# Patient Record
Sex: Female | Born: 1975 | Race: White | Hispanic: No | Marital: Married | Smoking: Never smoker
Health system: Southern US, Community
[De-identification: ages and names within clinical notes are randomized; demographics above are authoritative.]

## PROBLEM LIST (undated history)

## (undated) DIAGNOSIS — Z803 Family history of malignant neoplasm of breast: Secondary | ICD-10-CM

## (undated) DIAGNOSIS — Z973 Presence of spectacles and contact lenses: Secondary | ICD-10-CM

## (undated) DIAGNOSIS — E669 Obesity, unspecified: Secondary | ICD-10-CM

## (undated) DIAGNOSIS — G43909 Migraine, unspecified, not intractable, without status migrainosus: Secondary | ICD-10-CM

## (undated) HISTORY — DX: Obesity, unspecified: E66.9

## (undated) HISTORY — DX: Family history of malignant neoplasm of breast: Z80.3

## (undated) HISTORY — DX: Migraine, unspecified, not intractable, without status migrainosus: G43.909

## (undated) HISTORY — PX: VAGINAL HYSTERECTOMY: SUR661

## (undated) HISTORY — PX: TONSILLECTOMY: SUR1361

---
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
## 1999-06-01 HISTORY — PX: BREAST BIOPSY: SHX20

## 2005-05-31 HISTORY — PX: ROUX-EN-Y GASTRIC BYPASS: SHX1104

## 2009-11-07 ENCOUNTER — Ambulatory Visit: Payer: Self-pay

## 2009-11-08 ENCOUNTER — Ambulatory Visit: Payer: Self-pay

## 2011-04-07 ENCOUNTER — Ambulatory Visit: Payer: Self-pay

## 2011-05-19 ENCOUNTER — Ambulatory Visit: Payer: Self-pay

## 2011-07-07 LAB — HM MAMMOGRAPHY: HM Mammogram: NORMAL

## 2012-05-04 ENCOUNTER — Ambulatory Visit: Payer: Self-pay | Admitting: Family Medicine

## 2012-05-05 ENCOUNTER — Ambulatory Visit: Payer: Self-pay | Admitting: Internal Medicine

## 2013-05-08 ENCOUNTER — Ambulatory Visit: Payer: Self-pay

## 2013-06-17 ENCOUNTER — Ambulatory Visit: Payer: Self-pay | Admitting: Physician Assistant

## 2014-04-17 ENCOUNTER — Ambulatory Visit: Payer: Self-pay | Admitting: Physician Assistant

## 2014-08-28 LAB — LIPID PANEL
Cholesterol: 157 mg/dL (ref 0–200)
HDL: 53 mg/dL (ref 35–70)
LDL Cholesterol: 90 mg/dL
Triglycerides: 68 mg/dL (ref 40–160)

## 2014-08-28 LAB — TSH: TSH: 2.93 u[IU]/mL (ref <=5.90)

## 2014-08-28 LAB — HM PAP SMEAR

## 2014-09-16 ENCOUNTER — Encounter: Payer: Self-pay | Admitting: Internal Medicine

## 2014-09-16 DIAGNOSIS — Z9884 Bariatric surgery status: Secondary | ICD-10-CM | POA: Insufficient documentation

## 2014-09-16 DIAGNOSIS — Z1239 Encounter for other screening for malignant neoplasm of breast: Secondary | ICD-10-CM | POA: Insufficient documentation

## 2014-09-16 DIAGNOSIS — F5101 Primary insomnia: Secondary | ICD-10-CM | POA: Insufficient documentation

## 2014-09-16 DIAGNOSIS — K219 Gastro-esophageal reflux disease without esophagitis: Secondary | ICD-10-CM | POA: Insufficient documentation

## 2014-09-16 DIAGNOSIS — M545 Low back pain, unspecified: Secondary | ICD-10-CM | POA: Insufficient documentation

## 2014-09-16 DIAGNOSIS — Z87898 Personal history of other specified conditions: Secondary | ICD-10-CM | POA: Insufficient documentation

## 2014-09-16 DIAGNOSIS — G43009 Migraine without aura, not intractable, without status migrainosus: Secondary | ICD-10-CM | POA: Insufficient documentation

## 2014-09-21 ENCOUNTER — Ambulatory Visit
Admit: 2014-09-21 | Disposition: A | Discharge status: Home / Self Care | Payer: Self-pay | Attending: Family Medicine | Admitting: Family Medicine

## 2014-11-28 ENCOUNTER — Ambulatory Visit
Admission: EM | Admit: 2014-11-28 | Discharge: 2014-11-28 | Disposition: A | Discharge status: Home / Self Care | Payer: 59 | Attending: Emergency Medicine | Admitting: Emergency Medicine

## 2014-11-28 ENCOUNTER — Encounter: Payer: Self-pay | Admitting: Emergency Medicine

## 2014-11-28 DIAGNOSIS — N390 Urinary tract infection, site not specified: Secondary | ICD-10-CM

## 2014-11-28 DIAGNOSIS — R300 Dysuria: Secondary | ICD-10-CM | POA: Diagnosis present

## 2014-11-28 LAB — URINALYSIS COMPLETE WITH MICROSCOPIC (ARMC ONLY)
Bilirubin Urine: NEGATIVE
GLUCOSE, UA: NEGATIVE mg/dL
KETONES UR: NEGATIVE mg/dL
Nitrite: NEGATIVE
Protein, ur: NEGATIVE mg/dL
SPECIFIC GRAVITY, URINE: 1.01 (ref 1.005–1.030)
pH: 6 (ref 5.0–8.0)

## 2014-11-28 MED ORDER — CEPHALEXIN 500 MG PO CAPS: 500.0000 mg | ORAL_CAPSULE | Freq: Two times a day (BID) | ORAL | Status: DC

## 2014-11-28 MED ORDER — PHENAZOPYRIDINE HCL 200 MG PO TABS: 200.0000 mg | ORAL_TABLET | Freq: Three times a day (TID) | ORAL | Status: DC | PRN

## 2014-11-28 NOTE — ED Provider Notes (Addendum)
HPI  SUBJECTIVE:  Christina Carlson is a 39 y.o. female who presents with  pt with dysuria, urinary urgency, frequency, cloudy, oderous urine and  lower abdominal pain/pressure after urinating starting yesterday. C/o lower back pain.   No aggravating or alleviating factors.  Tried increasing fluids without improvement.  No nausea, vomiting, fever, chills.  No hematuria.  No anorexia, other abdominal pain, abdominal distention. Patient is currently on menses, but denies discharge/odor, vulvar itching, genital rash. Patient is in a monoagmous sexual relationship with a female partner who is asxatic.   Pt  is not pregnant. No recent antibiotic, diabetes, nephrolithiasis.  No h/o STD's, BV, yeast infections.  States this feels similar to previous UTIs.    No past medical history on file.    Past Surgical History  Procedure Laterality Date  . Roux-en-y gastric bypass  2007  . Inguinal hernia repair  1981  . Tonsillectomy      No family history on file.  History  Substance Use Topics  . Smoking status: Never Smoker   . Smokeless tobacco: Not on file  . Alcohol Use: No    No current facility-administered medications for this encounter.  Current outpatient prescriptions:  .  cephALEXin (KEFLEX) 500 MG capsule, Take 1 capsule (500 mg total) by mouth 2 (two) times daily., Disp: 14 capsule, Rfl: 0 .  Omeprazole 20 MG TBEC, Take 1 tablet by mouth daily., Disp: , Rfl:  .  phenazopyridine (PYRIDIUM) 200 MG tablet, Take 1 tablet (200 mg total) by mouth 3 (three) times daily as needed for pain., Disp: 6 tablet, Rfl: 0 .  zolpidem (AMBIEN CR) 12.5 MG CR tablet, Take 1 tablet by mouth at bedtime., Disp: , Rfl:   Allergies  Allergen Reactions  . Sulfa Antibiotics      ROS  As noted in HPI.   Physical Exam  BP 142/91 mmHg  Pulse 63  Temp(Src) 97.8 F (36.6 C) (Oral)  Resp 16  Ht  (1.626 m)  Wt 200 lb (90.719 kg)  BMI 34.31 kg/m2  SpO2 100%  LMP 11/24/2014 (Exact  Date)  Constitutional: Well developed, well nourished, no acute distress Eyes:  EOMI, conjunctiva normal bilaterally HENT: Normocephalic, atraumatic,mucus membranes moist Respiratory: Normal inspiratory effort Cardiovascular: Normal rate GI: nondistended soft, mild suprapubic tenderness. No flank tenderness Back: No CVA tenderness skin: No rash, skin intact Musculoskeletal: no deformities Neurologic: Alert & oriented x 3, no focal neuro deficits Psychiatric: Speech and behavior appropriate   ED Course   Medications - No data to display  Orders Placed This Encounter  Procedures  . Urine culture    Standing Status: Standing     Number of Occurrences: 1     Standing Expiration Date:     Order Specific Question:  Patient immune status    Answer:  Normal  . Urinalysis complete, with microscopic    Standing Status: Standing     Number of Occurrences: 1     Standing Expiration Date:     Results for orders placed or performed during the hospital encounter of 11/28/14 (from the past 24 hour(s))  Urinalysis complete, with microscopic     Status: Abnormal   Collection Time: 11/28/14  6:03 PM  Result Value Ref Range   Color, Urine STRAW (A) YELLOW   APPearance HAZY (A) CLEAR   Glucose, UA NEGATIVE NEGATIVE mg/dL   Bilirubin Urine NEGATIVE NEGATIVE   Ketones, ur NEGATIVE NEGATIVE mg/dL   Specific Gravity, Urine 1.010 1.005 - 1.030  Hgb urine dipstick 2+ (A) NEGATIVE   pH 6.0 5.0 - 8.0   Protein, ur NEGATIVE NEGATIVE mg/dL   Nitrite NEGATIVE NEGATIVE   Leukocytes, UA TRACE (A) NEGATIVE   RBC / HPF 0-5 <3 RBC/hpf   WBC, UA 0-5 <3 WBC/hpf   Bacteria, UA FEW (A) RARE   Squamous Epithelial / LPF 0-5 (A) RARE   No results found.  ED Clinical Impression  UTI (lower urinary tract infection)  ED Assessment/Plan  H&P, UA consistent with UTI.  Home with Keflex, Pyridium. No urine culture as this is uncomplicated lower UTI.   Discussed labs,  plan and followup with patient.  Discussed sn/sx that should prompt return to the UC or ED. Patient agrees with plan.   *This clinic note was created using Dragon dictation software. Therefore, there may be occasional mistakes despite careful proofreading.  ?   Domenick GongAshley Keeghan Mcintire, MD 11/28/14 1932  Domenick GongAshley Curt Oatis, MD 11/28/14 [phone]

## 2014-11-28 NOTE — ED Notes (Signed)
UTI symptoms started yesterday morning, pt has frequency, urgency , burning. Pt on period now.

## 2014-11-29 ENCOUNTER — Ambulatory Visit: Payer: Self-pay | Admitting: Internal Medicine

## 2014-11-30 LAB — URINE CULTURE: SPECIAL REQUESTS: NORMAL

## 2015-02-05 ENCOUNTER — Other Ambulatory Visit: Payer: Self-pay

## 2015-02-05 MED ORDER — ZOLPIDEM TARTRATE ER 12.5 MG PO TBCR: 12.5000 mg | EXTENDED_RELEASE_TABLET | Freq: Every day | ORAL | Status: DC

## 2015-02-19 ENCOUNTER — Other Ambulatory Visit: Payer: Self-pay | Admitting: Internal Medicine

## 2015-02-19 MED ORDER — ZOLPIDEM TARTRATE ER 12.5 MG PO TBCR: 12.5000 mg | EXTENDED_RELEASE_TABLET | Freq: Every day | ORAL | Status: DC

## 2015-03-04 ENCOUNTER — Ambulatory Visit
Admission: EM | Admit: 2015-03-04 | Discharge: 2015-03-04 | Disposition: A | Discharge status: Home / Self Care | Payer: BLUE CROSS/BLUE SHIELD | Attending: Family Medicine | Admitting: Family Medicine

## 2015-03-04 ENCOUNTER — Encounter: Payer: Self-pay | Admitting: Emergency Medicine

## 2015-03-04 DIAGNOSIS — B349 Viral infection, unspecified: Secondary | ICD-10-CM

## 2015-03-04 DIAGNOSIS — J069 Acute upper respiratory infection, unspecified: Secondary | ICD-10-CM

## 2015-03-04 LAB — RAPID INFLUENZA A&B ANTIGENS (ARMC ONLY): INFLUENZA A (ARMC): NOT DETECTED

## 2015-03-04 LAB — RAPID STREP SCREEN (MED CTR MEBANE ONLY): STREPTOCOCCUS, GROUP A SCREEN (DIRECT): NEGATIVE

## 2015-03-04 LAB — RAPID INFLUENZA A&B ANTIGENS: Influenza B (ARMC): NOT DETECTED

## 2015-03-04 MED ORDER — MELOXICAM 15 MG PO TABS: 15.0000 mg | ORAL_TABLET | Freq: Every day | ORAL | Status: DC

## 2015-03-04 MED ORDER — FEXOFENADINE-PSEUDOEPHED ER 180-240 MG PO TB24: 1.0000 | ORAL_TABLET | Freq: Every day | ORAL | Status: DC

## 2015-03-04 NOTE — ED Notes (Signed)
Patient c/o sore throat, cough, headaches, and stuffy nose since Saturday.

## 2015-03-04 NOTE — Discharge Instructions (Signed)
Upper Respiratory Infection, Adult An upper respiratory infection (URI) is also known as the common cold. It is often caused by a type of germ (virus). Colds are easily spread (contagious). You can pass it to others by kissing, coughing, sneezing, or drinking out of the same glass. Usually, you get better in 1 or 2 weeks.  HOME CARE   Only take medicine as told by your doctor.  Use a warm mist humidifier or breathe in steam from a hot shower.  Drink enough water and fluids to keep your pee (urine) clear or pale yellow.  Get plenty of rest.  Return to work when your temperature is back to normal or as told by your doctor. You may use a face mask and wash your hands to stop your cold from spreading. GET HELP RIGHT AWAY IF:   After the first few days, you feel you are getting worse.  You have questions about your medicine.  You have chills, shortness of breath, or brown or red spit (mucus).  You have yellow or brown snot (nasal discharge) or pain in the face, especially when you bend forward.  You have a fever, puffy (swollen) neck, pain when you swallow, or white spots in the back of your throat.  You have a bad headache, ear pain, sinus pain, or chest pain.  You have a high-pitched whistling sound when you breathe in and out (wheezing).  You have a lasting cough or cough up blood.  You have sore muscles or a stiff neck. MAKE SURE YOU:   Understand these instructions.  Will watch your condition.  Will get help right away if you are not doing well or get worse. Document Released: 11/03/2007 Document Revised: 08/09/2011 Document Reviewed: 08/22/2013 [facility] Patient Information 2015 [place], [place]. This information is not intended to replace advice given to you by your health care provider. Make sure you discuss any questions you have with your health care provider.  Viral Exanthems, Adult A viral exanthem is a rash. It occurs when a type of germ (virus) infects the skin. It  usually goes away on its own, without treatment. HOME CARE  Only take medicines as told by your doctor.  Drink enough water and fluids to keep your pee (urine) clear or pale yellow. GET HELP RIGHT AWAY IF:  You feel lumps or bumps in the neck.  Your face feels tender near the eyes and nose (sinuses).  You are not getting better after 3 days.  You have muscle aches.  You feel very tired.  You have a cough and thick spit (mucus).  You have a fever.  You have red eyes or eye pain.  You have sores in your mouth and trouble drinking or eating.  You have a sore throat with yellowish-white fluid (pus) and trouble swallowing.  You have neck pain or a stiff neck.  You get a severe headache.  You cannot stop throwing up (vomiting). MAKE SURE YOU:  Understand these instructions.  Will watch your condition.  Will get help right away if you are not doing well or get worse. Document Released: 09/01/2010 Document Revised: 08/09/2011 Document Reviewed: 09/01/2010 [facility] Patient Information 2015 [place], [place]. This information is not intended to replace advice given to you by your health care provider. Make sure you discuss any questions you have with your health care provider.

## 2015-03-04 NOTE — ED Provider Notes (Signed)
CSN: [id number]     Arrival date & time 03/04/15  1404 History   First MD Initiated Contact with Patient 03/04/15 1457     Chief Complaint  Patient presents with  . Sore Throat  . Cough  . Headache   (Consider location/radiation/quality/duration/timing/severity/associated sxs/prior Treatment) Patient is a 39 y.o. female presenting with pharyngitis, cough, headaches, and URI. The history is provided by the patient.  Sore Throat This is a new problem. The current episode started more than 2 days ago. Associated symptoms include headaches. Pertinent negatives include no abdominal pain and no shortness of breath. Associated symptoms comments: Headache was only yesterday.. Nothing relieves the symptoms. She has tried acetaminophen for the symptoms.  Cough Associated symptoms: headaches, myalgias and rhinorrhea   Associated symptoms: no shortness of breath   Headache Associated symptoms: cough, fatigue, myalgias and URI   Associated symptoms: no abdominal pain   URI Presenting symptoms: cough, fatigue and rhinorrhea   Severity:  Moderate Onset quality:  Sudden Progression:  Worsening Chronicity:  New Relieved by:  Nothing Ineffective treatments:  OTC medications Associated symptoms: headaches and myalgias     History reviewed. No pertinent past medical history. Past Surgical History  Procedure Laterality Date  . Roux-en-y gastric bypass  2007  . Inguinal hernia repair  1981  . Tonsillectomy     History reviewed. No pertinent family history. Social History  Substance Use Topics  . Smoking status: Never Smoker   . Smokeless tobacco: None  . Alcohol Use: No   OB History    No data available     Review of Systems  Constitutional: Positive for fatigue.  HENT: Positive for rhinorrhea.   Respiratory: Positive for cough. Negative for shortness of breath.   Gastrointestinal: Negative for abdominal pain.  Musculoskeletal: Positive for myalgias.  Neurological: Positive for  headaches.  All other systems reviewed and are negative.   Allergies  Sulfa antibiotics  Home Medications   Prior to Admission medications   Medication Sig Start Date End Date Taking? Authorizing Provider  zolpidem (AMBIEN) 10 MG tablet Take 10 mg by mouth at bedtime as needed for sleep.   Yes Historical Provider, MD  cephALEXin (KEFLEX) 500 MG capsule Take 1 capsule (500 mg total) by mouth 2 (two) times daily. 11/28/14   Domenick Gong, MD  fexofenadine-pseudoephedrine (ALLEGRA-D ALLERGY & CONGESTION) 180-240 MG 24 hr tablet Take 1 tablet by mouth daily. 03/04/15   Hassan Rowan, MD  meloxicam (MOBIC) 15 MG tablet Take 1 tablet (15 mg total) by mouth daily. 03/04/15   Hassan Rowan, MD  Omeprazole 20 MG TBEC Take 1 tablet by mouth daily. 08/28/14   Historical Provider, MD  phenazopyridine (PYRIDIUM) 200 MG tablet Take 1 tablet (200 mg total) by mouth 3 (three) times daily as needed for pain. 11/28/14   Domenick Gong, MD  zolpidem (AMBIEN CR) 12.5 MG CR tablet Take 1 tablet (12.5 mg total) by mouth at bedtime. 02/19/15   [name], MD   Meds Ordered and Administered this Visit  Medications - No data to display  BP 121/64 mmHg  Pulse 78  Temp(Src) 98.6 F (37 C) (Tympanic)  Resp 16  Ht  (1.626 m)  Wt 190 lb (86.183 kg)  BMI 32.60 kg/m2  SpO2 100%  LMP 02/23/2015 (Exact Date) No data found.   Physical Exam  Constitutional: She is oriented to person, place, and time. She appears well-developed and well-nourished.  HENT:  Head: Normocephalic and atraumatic.  Right Ear: Hearing, tympanic  membrane and external ear normal.  Left Ear: Hearing, tympanic membrane and external ear normal.  Nose: Mucosal edema and rhinorrhea present. Right sinus exhibits maxillary sinus tenderness. Left sinus exhibits maxillary sinus tenderness.  Mouth/Throat: No oral lesions. No dental abscesses. Posterior oropharyngeal erythema present.  Eyes: Conjunctivae are normal. Pupils are equal, round,  and reactive to light.  Neck: Normal range of motion. Neck supple.  Cardiovascular: Normal rate and regular rhythm.   Pulmonary/Chest: Effort normal and breath sounds normal.  Musculoskeletal: Normal range of motion.  Neurological: She is alert and oriented to person, place, and time.  Skin: Skin is warm and dry.  Psychiatric: She has a normal mood and affect. Her behavior is normal.  Vitals reviewed.   ED Course  Procedures (including critical care time)  Labs Review Labs Reviewed  RAPID STREP SCREEN (NOT AT [facility])  INFLUENZA A&B ANTIGENS ([facility] ONLY)  CULTURE, GROUP A STREP ([facility] ONLY)    Imaging Review No results found.   Visual Acuity Review  Right Eye Distance:   Left Eye Distance:   Bilateral Distance:    Right Eye Near:   Left Eye Near:    Bilateral Near:         MDM   1. Nonspecific syndrome suggestive of viral illness   2. URI, acute       Hassan Rowan, MD 03/04/15 [phone]

## 2015-03-06 LAB — CULTURE, GROUP A STREP (THRC)

## 2015-03-13 ENCOUNTER — Other Ambulatory Visit: Payer: Self-pay | Admitting: Internal Medicine

## 2015-03-13 MED ORDER — ZOLPIDEM TARTRATE 10 MG PO TABS: 10.0000 mg | ORAL_TABLET | Freq: Every evening | ORAL | Status: DC | PRN

## 2015-05-07 ENCOUNTER — Other Ambulatory Visit: Payer: Self-pay | Admitting: Internal Medicine

## 2015-05-07 ENCOUNTER — Encounter: Payer: Self-pay | Admitting: Internal Medicine

## 2015-05-07 ENCOUNTER — Ambulatory Visit (INDEPENDENT_AMBULATORY_CARE_PROVIDER_SITE_OTHER): Payer: BLUE CROSS/BLUE SHIELD | Admitting: Internal Medicine

## 2015-05-07 VITALS — BP 120/68 | HR 68 | Ht 64.0 in | Wt 219.0 lb

## 2015-05-07 DIAGNOSIS — Z9884 Bariatric surgery status: Secondary | ICD-10-CM

## 2015-05-07 DIAGNOSIS — E538 Deficiency of other specified B group vitamins: Secondary | ICD-10-CM | POA: Diagnosis not present

## 2015-05-07 DIAGNOSIS — M545 Low back pain, unspecified: Secondary | ICD-10-CM

## 2015-05-07 NOTE — Progress Notes (Signed)
Date:  05/07/2015   Name:  Christina MadeiraCristy L Carlson   DOB:  06-23-1975   MRN:  [id number]   Chief Complaint: Hyperlipidemia  Patient is here to discuss a recent cholesterol profile done for an insurance physical. Her total cholesterol was 202 with an HDL of 53 and an LDL of 120. Her total cholesterol to HDL ratio is 3.5. She is not hypertensive, diabetic, or a smoker so her risk for heart disease over the next 10 years is extremely low and no medication is recommended.  B12 deficiency - patient had a low B12 level earlier this year. she was encouraged to take a B12 supplement on a daily basis. However, she has not started it yet. She feels fairly well without significant fatigue and low energy or muscle discomfort .  Review of Systems  Constitutional: Negative for fever, chills and fatigue.  Respiratory: Negative for chest tightness, shortness of breath and wheezing.   Cardiovascular: Negative for chest pain, palpitations and leg swelling.  Musculoskeletal: Negative for myalgias and arthralgias.  Skin: Negative for color change and rash.  Neurological: Negative for headaches.  Psychiatric/Behavioral: Positive for sleep disturbance (unless she takes [place]).    Patient Active Problem List   Diagnosis Date Noted  . Vitamin B12 nutritional deficiency 05/07/2015  . LBP (low back pain) 09/16/2014  . Breast screening 09/16/2014  . Bariatric surgery status 09/16/2014  . Gastro-esophageal reflux disease without esophagitis 09/16/2014  . H/O motion sickness 09/16/2014  . Migraine without aura and responsive to treatment 09/16/2014  . Idiopathic insomnia 09/16/2014    Prior to Admission medications   Medication Sig Start Date End Date Taking? Authorizing Provider  LORZONE 750 MG TABS Take 1 tablet by mouth 3 (three) times daily. 05/03/15  Yes Historical Provider, MD  zolpidem (AMBIEN) 10 MG tablet Take 1 tablet by mouth at bedtime as needed. 04/17/15  Yes Historical Provider, MD    Allergies   Allergen Reactions  . Sulfa Antibiotics     Past Surgical History  Procedure Laterality Date  . Roux-en-y gastric bypass  2007  . Inguinal hernia repair  1981  . Tonsillectomy      Social History  Substance Use Topics  . Smoking status: Never Smoker   . Smokeless tobacco: None  . Alcohol Use: No    Medication list has been reviewed and updated.   Physical Exam  Constitutional: She is oriented to person, place, and time. She appears well-developed. No distress.  HENT:  Head: Normocephalic and atraumatic.  Eyes: Conjunctivae are normal. Right eye exhibits no discharge. Left eye exhibits no discharge. No scleral icterus.  Pulmonary/Chest: Effort normal. No respiratory distress.  Musculoskeletal: Normal range of motion.  Neurological: She is alert and oriented to person, place, and time.  Skin: Skin is warm and dry. No rash noted.  Psychiatric: She has a normal mood and affect. Her behavior is normal. Thought content normal.    BP 120/68 mmHg  Pulse 68  Ht 5\' 4"  (1.626 m)  Wt 219 lb (99.338 kg)  BMI 37.57 kg/m2  SpO2 98%  Assessment and Plan: 1. Vitamin B12 nutritional deficiency Patient is encouraged to begin sublingual vitamin D supplement daily  2. Bariatric surgery status Doing well with recently normal labs Cholesterol is slightly higher than previously - no meds are needed  follow a low-fat diet and regular exercise   Bari EdwardLaura Kannon Baum, MD [facility]  05/07/2015

## 2015-05-16 ENCOUNTER — Encounter: Payer: Self-pay | Admitting: *Deleted

## 2015-05-16 ENCOUNTER — Ambulatory Visit
Admission: EM | Admit: 2015-05-16 | Discharge: 2015-05-16 | Disposition: A | Discharge status: Home / Self Care | Payer: BLUE CROSS/BLUE SHIELD | Attending: Family Medicine | Admitting: Family Medicine

## 2015-05-16 DIAGNOSIS — J029 Acute pharyngitis, unspecified: Secondary | ICD-10-CM | POA: Diagnosis not present

## 2015-05-16 LAB — RAPID STREP SCREEN (MED CTR MEBANE ONLY): Streptococcus, Group A Screen (Direct): NEGATIVE

## 2015-05-16 MED ORDER — AMOXICILLIN 875 MG PO TABS: 875.0000 mg | ORAL_TABLET | Freq: Two times a day (BID) | ORAL | Status: DC

## 2015-05-16 NOTE — Discharge Instructions (Signed)

## 2015-05-16 NOTE — ED Provider Notes (Signed)
[facility]  ____________________________________________  Time seen: Approximately 6:56 PM  I have reviewed the triage vital signs and the nursing notes.   HISTORY  Chief Complaint Sore Throat   HPI Christina Carlson is a 39 y.o. female presents for complaints of sore throat since yesterday. States quick onset and has persisted. Reports scratchy and sore to swallow at 6/10. Denies other pain or radiation. Reports continues to drink fluids well, with slight decrease in appetite. Denies sick contacts at home but reports frequently around school aged children. Also reports mild headache earlier, denies headache now.   Denies cough, nasal congestion, chest pain, shortness of breath, abdominal pain, rash or other concerns.    History reviewed. No pertinent past medical history.  Patient Active Problem List   Diagnosis Date Noted  . Vitamin B12 nutritional deficiency 05/07/2015  . LBP (low back pain) 09/16/2014  . Breast screening 09/16/2014  . Bariatric surgery status 09/16/2014  . Gastro-esophageal reflux disease without esophagitis 09/16/2014  . H/O motion sickness 09/16/2014  . Migraine without aura and responsive to treatment 09/16/2014  . Idiopathic insomnia 09/16/2014    Past Surgical History  Procedure Laterality Date  . Roux-en-y gastric bypass  2007  . Inguinal hernia repair  1981  . Tonsillectomy      Current Outpatient Rx  Name  Route  Sig  Dispense  Refill  . LORZONE 750 MG TABS   Oral   Take 1 tablet by mouth 3 (three) times daily.      0     Dispense as written.   . zolpidem (AMBIEN) 10 MG tablet   Oral   Take 1 tablet by mouth at bedtime as needed.      5   .             Allergies Sulfa antibiotics  History reviewed. No pertinent family history.  Social History Social History  Substance Use Topics  . Smoking status: Never Smoker   . Smokeless tobacco: None  . Alcohol Use: No    Review of Systems Constitutional: No  fever/chills Eyes: No visual changes. ENT: positive sore throat.  Cardiovascular: Denies chest pain. Respiratory: Denies shortness of breath. Gastrointestinal: No abdominal pain.  No nausea, no vomiting.  No diarrhea.  No constipation. Genitourinary: Negative for dysuria. Musculoskeletal: Negative for back pain. Skin: Negative for rash. Neurological: Negative for headaches, focal weakness or numbness.  10-point ROS otherwise negative.  ____________________________________________   PHYSICAL EXAM:  VITAL SIGNS: ED Triage Vitals  Enc Vitals Group     BP 05/16/15 1820 139/97 mmHg     Pulse Rate 05/16/15 1820 79     Resp 05/16/15 1820 18     Temp 05/16/15 1820 97.7 F (36.5 C)     Temp Source 05/16/15 1820 Oral     SpO2 05/16/15 1820 100 %     Weight 05/16/15 1820 220 lb (99.791 kg)     Height 05/16/15 1820  (1.626 m)     Head Cir --      Peak Flow --      Pain Score --      Pain Loc --      Pain Edu? --      Excl. in GC? --     Constitutional: Alert and oriented. Well appearing and in no acute distress. Eyes: Conjunctivae are normal. PERRL. EOMI. Head: Atraumatic. Nontender, no swelling, no erythema.   Ears: no erythema, normal TMs bilaterally.   Nose: No congestion/rhinnorhea.  Mouth/Throat: Mucous membranes are moist.  Mod pharyngeal erythema, no exudate. No uvular shift or deviation.  Neck: No stridor.  No cervical spine tenderness to palpation. Hematological/Lymphatic/Immunilogical: Mild anterior cervical lymphadenopathy. Cardiovascular: Normal rate, regular rhythm. Grossly normal heart sounds.  Good peripheral circulation. Respiratory: Normal respiratory effort.  No retractions. Lungs CTAB. Gastrointestinal: Soft and nontender. No distention. Normal Bowel sounds.  No hepatomegaly or splenomegaly palpated.  Musculoskeletal: No lower or upper extremity tenderness nor edema.   Neurologic:  Normal speech and language. No gross focal neurologic deficits are  appreciated. No gait instability. Skin:  Skin is warm, dry and intact. No rash noted. Psychiatric: Mood and affect are normal. Speech and behavior are normal.  ____________________________________________   LABS (all labs ordered are listed, but only abnormal results are displayed)  Labs Reviewed  RAPID STREP SCREEN ([facility])  CULTURE, GROUP A STREP ([facility] ONLY)    INITIAL IMPRESSION / ASSESSMENT AND PLAN / ED COURSE  Pertinent labs & imaging results that were available during my care of the patient were reviewed by me and considered in my medical decision making (see chart for details).  Very well appearing. No acute distress. Presents for sore throat since yesterday. Mod pharyngeal erythema. Quick strep negative, will culture. However reports exposed to children recently with strep. Reports continues to drink fluids well. Concern for streptococcal pharyngitis and will treat with oral amoxicillin and supportive treatments including rest, fluids, prn otc tylenol, warm salt water gargles, lozenges. Discussed follow up with Primary care physician this week. Discussed follow up and return parameters including no resolution or any worsening concerns. Patient verbalized understanding and agreed to plan.   ____________________________________________   FINAL CLINICAL IMPRESSION(S) / ED DIAGNOSES  Final diagnoses:  Pharyngitis       Renford DillsLindsey Vidya Bamford, NP 05/21/15 1742

## 2015-05-16 NOTE — ED Notes (Signed)
Patient started having symptoms of swollen neck glands and sore throat yesterday. She has a history of strep throat. Additional symptoms are headache with no fever.

## 2015-05-18 LAB — CULTURE, GROUP A STREP (THRC)

## 2015-06-27 ENCOUNTER — Ambulatory Visit (INDEPENDENT_AMBULATORY_CARE_PROVIDER_SITE_OTHER): Payer: BLUE CROSS/BLUE SHIELD | Admitting: Internal Medicine

## 2015-06-27 ENCOUNTER — Encounter: Payer: Self-pay | Admitting: Internal Medicine

## 2015-06-27 VITALS — BP 122/68 | HR 72 | Ht 64.0 in | Wt 216.0 lb

## 2015-06-27 DIAGNOSIS — Z30011 Encounter for initial prescription of contraceptive pills: Secondary | ICD-10-CM

## 2015-06-27 DIAGNOSIS — R87610 Atypical squamous cells of undetermined significance on cytologic smear of cervix (ASC-US): Secondary | ICD-10-CM | POA: Insufficient documentation

## 2015-06-27 DIAGNOSIS — E538 Deficiency of other specified B group vitamins: Secondary | ICD-10-CM | POA: Diagnosis not present

## 2015-06-27 MED ORDER — CYANOCOBALAMIN 1000 MCG/ML IJ SOLN
1000.0000 ug | INTRAMUSCULAR | Status: DC
  Administered 2015-06-27 – 2017-12-14 (×8): 1000 ug via INTRAMUSCULAR

## 2015-06-27 MED ORDER — DROSPIRENONE-ETHINYL ESTRADIOL 3-0.02 MG PO TABS: 1.0000 | ORAL_TABLET | Freq: Every day | ORAL | Status: DC

## 2015-06-27 NOTE — Progress Notes (Signed)
Date:  06/27/2015   Name:  Christina Carlson   DOB:  03-25-1976   MRN:  [id number]   Chief Complaint: Follow-up; Vitamin B12 Nutritional Deficiency; and Contraception HPI Vitamin B12 deficiency - can not be complaint with oral B12 drops.  She is s/p gastric bypass and can not absorb very well. When she was getting injections she felt much better and would like to resume those. Contraception - she stopped her birth control pills several years ago and has been using no form of contraception. She is over 40 she is more concerned for prevention and would like to resume them. She had no side effects. She is a nonsmoker. No history of stroke or DVT.   Review of Systems  Constitutional: Positive for unexpected weight change (gained back 20 lbs - resuming exercise and diet). Negative for fever, chills and fatigue.  Eyes: Negative for visual disturbance.  Respiratory: Negative for cough, chest tightness and shortness of breath.   Cardiovascular: Negative for chest pain, palpitations and leg swelling.  Genitourinary: Negative for dysuria and menstrual problem.  Musculoskeletal: Negative for arthralgias and gait problem.    Patient Active Problem List   Diagnosis Date Noted  . Vitamin B12 nutritional deficiency 05/07/2015  . LBP (low back pain) 09/16/2014  . Breast screening 09/16/2014  . Bariatric surgery status 09/16/2014  . Gastro-esophageal reflux disease without esophagitis 09/16/2014  . H/O motion sickness 09/16/2014  . Migraine without aura and responsive to treatment 09/16/2014  . Idiopathic insomnia 09/16/2014    Prior to Admission medications   Medication Sig Start Date End Date Taking? Authorizing Provider  LORZONE 750 MG TABS Take 1 tablet by mouth 3 (three) times daily. 05/03/15  Yes Historical Provider, MD  zolpidem (AMBIEN) 10 MG tablet Take 1 tablet by mouth at bedtime as needed. 04/17/15  Yes Historical Provider, MD    Allergies  Allergen Reactions  . Sulfa Antibiotics      Past Surgical History  Procedure Laterality Date  . Roux-en-y gastric bypass  2007  . Inguinal hernia repair  1981  . Tonsillectomy      Social History  Substance Use Topics  . Smoking status: Never Smoker   . Smokeless tobacco: None  . Alcohol Use: No     Medication list has been reviewed and updated.   Physical Exam  Constitutional: She is oriented to person, place, and time. She appears well-developed and well-nourished.  Eyes: Conjunctivae are normal. Pupils are equal, round, and reactive to light.  Neck: Normal range of motion. Neck supple. No thyromegaly present.  Cardiovascular: Normal rate, regular rhythm and normal heart sounds.   Pulmonary/Chest: Effort normal and breath sounds normal.  Musculoskeletal: She exhibits no edema or tenderness.  Lymphadenopathy:    She has no cervical adenopathy.  Neurological: She is alert and oriented to person, place, and time.  Skin: Skin is warm and dry.  Psychiatric: She has a normal mood and affect. Her behavior is normal.    BP 122/68 mmHg  Pulse 72  Ht  (1.626 m)  Wt 216 lb (97.977 kg)  BMI 37.06 kg/m2  LMP 06/10/2015  Assessment and Plan: 1. Encounter for initial prescription of contraceptive pills Resume oral contraceptives Return for physical in 4 months - drospirenone-ethinyl estradiol (YAZ,GIANVI,LORYNA) 3-0.02 MG tablet; Take 1 tablet by mouth daily.  Dispense: 1 Package; Refill: 11  2. Vitamin B12 nutritional deficiency Begin monthly injections - cyanocobalamin ((VITAMIN B-12)) injection 1,000 mcg; Inject 1 mL (1,000 mcg total)  into the muscle every 30 (thirty) days.  3. Atypical squamous cells of undetermined significance on cytologic smear of cervix (ASC-US) HPV was negative Repeat this year  Bari Edward, MD [facility]  06/27/2015

## 2015-08-15 ENCOUNTER — Other Ambulatory Visit: Payer: Self-pay | Admitting: Internal Medicine

## 2015-09-03 ENCOUNTER — Ambulatory Visit (INDEPENDENT_AMBULATORY_CARE_PROVIDER_SITE_OTHER): Payer: BLUE CROSS/BLUE SHIELD | Admitting: Internal Medicine

## 2015-09-03 ENCOUNTER — Encounter: Payer: Self-pay | Admitting: Internal Medicine

## 2015-09-03 VITALS — BP 140/82 | HR 72 | Ht 64.0 in | Wt 212.8 lb

## 2015-09-03 DIAGNOSIS — K219 Gastro-esophageal reflux disease without esophagitis: Secondary | ICD-10-CM

## 2015-09-03 DIAGNOSIS — Z1239 Encounter for other screening for malignant neoplasm of breast: Secondary | ICD-10-CM | POA: Diagnosis not present

## 2015-09-03 DIAGNOSIS — Z0000 Encounter for general adult medical examination without abnormal findings: Secondary | ICD-10-CM | POA: Diagnosis not present

## 2015-09-03 DIAGNOSIS — R87610 Atypical squamous cells of undetermined significance on cytologic smear of cervix (ASC-US): Secondary | ICD-10-CM | POA: Diagnosis not present

## 2015-09-03 DIAGNOSIS — E538 Deficiency of other specified B group vitamins: Secondary | ICD-10-CM

## 2015-09-03 DIAGNOSIS — F5101 Primary insomnia: Secondary | ICD-10-CM

## 2015-09-03 LAB — POCT URINALYSIS DIPSTICK
Bilirubin, UA: NEGATIVE
Glucose, UA: NEGATIVE
Ketones, UA: NEGATIVE
LEUKOCYTES UA: NEGATIVE
NITRITE UA: NEGATIVE
PH UA: 6.5
PROTEIN UA: NEGATIVE
Spec Grav, UA: 1.015
Urobilinogen, UA: 0.2

## 2015-09-03 MED ORDER — CYANOCOBALAMIN 1000 MCG/ML IJ SOLN
1000.0000 ug | INTRAMUSCULAR | Status: DC
  Administered 2015-09-17 – 2016-04-14 (×7): 1000 ug via INTRAMUSCULAR

## 2015-09-03 MED ORDER — SCOPOLAMINE 1 MG/3DAYS TD PT72: 1.0000 | MEDICATED_PATCH | TRANSDERMAL | Status: DC

## 2015-09-03 MED ORDER — CYANOCOBALAMIN 1000 MCG/ML IJ SOLN
1000.0000 ug | Freq: Once | INTRAMUSCULAR | Status: AC
  Administered 2015-09-03: 1000 ug via INTRAMUSCULAR

## 2015-09-03 NOTE — Progress Notes (Signed)
Date:  09/03/2015   Name:  Christina Carlson   DOB:  [birth year]   MRN:  [id number]   Chief Complaint: Annual Exam Christina Carlson is a 40 y.o. female who presents today for her Complete Annual Exam. She feels well. She reports exercising walking daily. She reports she is sleeping fairly well with Ambien - still takes about an hour to fall asleep.  [facility] Last pap in 2016 was ASCUS with neg HPV.  Periods are regular and lighter now on OCPs.   Review of Systems  Constitutional: Negative for fever, chills and fatigue.  HENT: Negative for congestion, hearing loss, tinnitus, trouble swallowing and voice change.   Eyes: Negative for visual disturbance.  Respiratory: Negative for cough, chest tightness, shortness of breath and wheezing.   Cardiovascular: Negative for chest pain, palpitations and leg swelling.  Gastrointestinal: Negative for vomiting, abdominal pain, diarrhea and constipation.  Endocrine: Negative for polydipsia and polyuria.  Genitourinary: Negative for dysuria, frequency, vaginal bleeding, vaginal discharge and genital sores.  Musculoskeletal: Negative for joint swelling, arthralgias and gait problem.  Skin: Negative for color change and rash.  Neurological: Negative for dizziness, tremors, light-headedness and headaches.  Hematological: Negative for adenopathy. Does not bruise/bleed easily.  Psychiatric/Behavioral: Negative for sleep disturbance and dysphoric mood. The patient is not nervous/anxious.     Patient Active Problem List   Diagnosis Date Noted  . Atypical squamous cells of undetermined significance on cytologic smear of cervix (ASC-US) 06/27/2015  . Vitamin B12 nutritional deficiency 05/07/2015  . LBP (low back pain) 09/16/2014  . Breast screening 09/16/2014  . Bariatric surgery status 09/16/2014  . Gastro-esophageal reflux disease without esophagitis 09/16/2014  . H/O motion sickness 09/16/2014  . Migraine without aura and responsive  to treatment 09/16/2014  . Idiopathic insomnia 09/16/2014    Prior to Admission medications   Medication Sig Start Date End Date Taking? Authorizing Provider  drospirenone-ethinyl estradiol (YAZ,GIANVI,LORYNA) 3-0.02 MG tablet Take 1 tablet by mouth daily. 06/27/15  Yes Reubin MilanLaura H Berglund, MD  LORZONE 750 MG TABS Take 1 tablet by mouth 3 (three) times daily. 05/03/15  Yes Historical Provider, MD  zolpidem (AMBIEN) 10 MG tablet TAKE 1 TABLET BY MOUTH AT BEDTIME AS NEEDED FOR SLEEP 08/18/15  Yes Reubin MilanLaura H Berglund, MD    Allergies  Allergen Reactions  . Sulfa Antibiotics     Past Surgical History  Procedure Laterality Date  . Roux-en-y gastric bypass  2007  . Inguinal hernia repair  1981  . Tonsillectomy      Social History  Substance Use Topics  . Smoking status: Never Smoker   . Smokeless tobacco: None  . Alcohol Use: No     Medication list has been reviewed and updated.   Physical Exam  Constitutional: She is oriented to person, place, and time. She appears well-developed and well-nourished. No distress.  HENT:  Head: Normocephalic and atraumatic.  Right Ear: Tympanic membrane and ear canal normal.  Left Ear: Tympanic membrane and ear canal normal.  Nose: Right sinus exhibits no maxillary sinus tenderness. Left sinus exhibits no maxillary sinus tenderness.  Mouth/Throat: Uvula is midline and oropharynx is clear and moist.  Eyes: Conjunctivae and EOM are normal. Right eye exhibits no discharge. Left eye exhibits no discharge. No scleral icterus.  Neck: Normal range of motion. Carotid bruit is not present. No erythema present. No thyromegaly present.  Cardiovascular: Normal rate, regular rhythm, normal heart sounds and normal pulses.   Pulmonary/Chest: Effort  normal. No respiratory distress. She has no wheezes. Right breast exhibits no mass, no nipple discharge, no skin change and no tenderness. Left breast exhibits no mass, no nipple discharge, no skin change and no tenderness.    Abdominal: Soft. Bowel sounds are normal. There is no hepatosplenomegaly. There is no tenderness. There is no CVA tenderness.  Genitourinary: Vagina normal and uterus normal. There is no tenderness, lesion or injury on the right labia. There is no tenderness, lesion or injury on the left labia. Cervix exhibits discharge (scant menstrual blood). Cervix exhibits no motion tenderness and no friability. Right adnexum displays no mass, no tenderness and no fullness. Left adnexum displays no mass, no tenderness and no fullness.  Musculoskeletal: Normal range of motion.  Lymphadenopathy:    She has no cervical adenopathy.    She has no axillary adenopathy.  Neurological: She is alert and oriented to person, place, and time. She has normal reflexes. No cranial nerve deficit or sensory deficit.  Skin: Skin is warm, dry and intact. No rash noted.  Psychiatric: She has a normal mood and affect. Her speech is normal and behavior is normal. Thought content normal.  Nursing note and vitals reviewed.   BP 140/82 mmHg  Pulse 72  Ht  (1.626 m)  Wt 212 lb 12.8 oz (96.525 kg)  BMI 36.51 kg/m2  LMP 08/30/2015  Assessment and Plan: 1. Annual physical exam Encourage regular exercise - POCT urinalysis dipstick - Comprehensive metabolic panel - Lipid panel  2. Breast cancer screening Rx given to schedule at [facility]  3. Atypical squamous cells of undetermined significance on cytologic smear of cervix (ASC-US) Repeat Pap today - Pap IG and HPV (high risk) DNA detection  4. Gastro-esophageal reflux disease without esophagitis stable - CBC with Differential/Platelet  5. Vitamin B12 nutritional deficiency Continue B12 injections monthly - adjust if needed - cyanocobalamin ((VITAMIN B-12)) injection 1,000 mcg; Inject 1 mL (1,000 mcg total) into the muscle once. - B12 - cyanocobalamin ((VITAMIN B-12)) injection 1,000 mcg; Inject 1 mL (1,000 mcg total) into the muscle every 30 (thirty) days.  6.  Idiopathic insomnia Doing fairly well on Ambien - TSH   Christina Edward, MD [facility]  09/03/2015

## 2015-09-03 NOTE — Patient Instructions (Signed)
Breast Self-Awareness Practicing breast self-awareness may pick up problems early, prevent significant medical complications, and possibly save your life. By practicing breast self-awareness, you can become familiar with how your breasts look and feel and if your breasts are changing. This allows you to notice changes early. It can also offer you some reassurance that your breast health is good. One way to learn what is normal for your breasts and whether your breasts are changing is to do a breast self-exam. If you find a lump or something that was not present in the past, it is best to contact your caregiver right away. Other findings that should be evaluated by your caregiver include nipple discharge, especially if it is bloody; skin changes or reddening; areas where the skin seems to be pulled in (retracted); or new lumps and bumps. Breast pain is seldom associated with cancer (malignancy), but should also be evaluated by a caregiver. HOW TO PERFORM A BREAST SELF-EXAM The best time to examine your breasts is 5-7 days after your menstrual period is over. During menstruation, the breasts are lumpier, and it may be more difficult to pick up changes. If you do not menstruate, have reached menopause, or had your uterus removed (hysterectomy), you should examine your breasts at regular intervals, such as monthly. If you are breastfeeding, examine your breasts after a feeding or after using a breast pump. Breast implants do not decrease the risk for lumps or tumors, so continue to perform breast self-exams as recommended. Talk to your caregiver about how to determine the difference between the implant and breast tissue. Also, talk about the amount of pressure you should use during the exam. Over time, you will become more familiar with the variations of your breasts and more comfortable with the exam. A breast self-exam requires you to remove all your clothes above the waist. 1. Look at your breasts and nipples.  Stand in front of a mirror in a room with good lighting. With your hands on your hips, push your hands firmly downward. Look for a difference in shape, contour, and size from one breast to the other (asymmetry). Asymmetry includes puckers, dips, or bumps. Also, look for skin changes, such as reddened or scaly areas on the breasts. Look for nipple changes, such as discharge, dimpling, repositioning, or redness. 2. Carefully feel your breasts. This is best done either in the shower or tub while using soapy water or when flat on your back. Place the arm (on the side of the breast you are examining) above your head. Use the pads (not the fingertips) of your three middle fingers on your opposite hand to feel your breasts. Start in the underarm area and use  inch (2 cm) overlapping circles to feel your breast. Use 3 different levels of pressure (light, medium, and firm pressure) at each circle before moving to the next circle. The light pressure is needed to feel the tissue closest to the skin. The medium pressure will help to feel breast tissue a little deeper, while the firm pressure is needed to feel the tissue close to the ribs. Continue the overlapping circles, moving downward over the breast until you feel your ribs below your breast. Then, move one finger-width towards the center of the body. Continue to use the  inch (2 cm) overlapping circles to feel your breast as you move slowly up toward the collar bone (clavicle) near the base of the neck. Continue the up and down exam using all 3 pressures until you reach the   middle of the chest. Do this with each breast, carefully feeling for lumps or changes. 3.  Keep a written record with breast changes or normal findings for each breast. By writing this information down, you do not need to depend only on memory for size, tenderness, or location. Write down where you are in your menstrual cycle, if you are still menstruating. Breast tissue can have some lumps or  thick tissue. However, see your caregiver if you find anything that concerns you.  SEEK MEDICAL CARE IF:  You see a change in shape, contour, or size of your breasts or nipples.   You see skin changes, such as reddened or scaly areas on the breasts or nipples.   You have an unusual discharge from your nipples.   You feel a new lump or unusually thick areas.    This information is not intended to replace advice given to you by your health care provider. Make sure you discuss any questions you have with your health care provider.   Document Released: 05/17/2005 Document Revised: 05/03/2012 Document Reviewed: 09/01/2011 Elsevier Interactive Patient Education 2016 [employer] Inc.  

## 2015-09-04 ENCOUNTER — Telehealth: Payer: Self-pay

## 2015-09-04 LAB — CBC WITH DIFFERENTIAL/PLATELET
BASOS: 0 %
Basophils Absolute: 0 10*3/uL (ref 0.0–0.2)
EOS (ABSOLUTE): 0.2 10*3/uL (ref 0.0–0.4)
Eos: 2 %
HEMATOCRIT: 32.7 % — AB (ref 34.0–46.6)
HEMOGLOBIN: 10.4 g/dL — AB (ref 11.1–15.9)
Immature Grans (Abs): 0 10*3/uL (ref 0.0–0.1)
Immature Granulocytes: 0 %
LYMPHS: 31 %
Lymphocytes Absolute: 2.1 10*3/uL (ref 0.7–3.1)
MCH: 25.1 pg — AB (ref 26.6–33.0)
MCHC: 31.8 g/dL (ref 31.5–35.7)
MCV: 79 fL (ref 79–97)
MONOS ABS: 0.3 10*3/uL (ref 0.1–0.9)
Monocytes: 5 %
Neutrophils Absolute: 4.2 10*3/uL (ref 1.4–7.0)
Neutrophils: 62 %
Platelets: 389 10*3/uL — ABNORMAL HIGH (ref 150–379)
RBC: 4.15 x10E6/uL (ref 3.77–5.28)
RDW: 16.1 % — AB (ref 12.3–15.4)
WBC: 6.8 10*3/uL (ref 3.4–10.8)

## 2015-09-04 LAB — COMPREHENSIVE METABOLIC PANEL
A/G RATIO: 1.3 (ref 1.2–2.2)
ALT: 8 IU/L (ref 0–32)
AST: 12 IU/L (ref 0–40)
Albumin: 3.7 g/dL (ref 3.5–5.5)
Alkaline Phosphatase: 79 IU/L (ref 39–117)
BUN/Creatinine Ratio: 9 (ref 9–23)
BUN: 7 mg/dL (ref 6–24)
Bilirubin Total: 0.2 mg/dL (ref 0.0–1.2)
CO2: 20 mmol/L (ref 18–29)
Calcium: 9.3 mg/dL (ref 8.7–10.2)
Chloride: 103 mmol/L (ref 96–106)
Creatinine, Ser: 0.78 mg/dL (ref 0.57–1.00)
GFR calc Af Amer: 109 mL/min/{1.73_m2} (ref >59)
GFR, EST NON AFRICAN AMERICAN: 95 mL/min/{1.73_m2} (ref >59)
GLOBULIN, TOTAL: 2.9 g/dL (ref 1.5–4.5)
Glucose: 91 mg/dL (ref 65–99)
POTASSIUM: 3.8 mmol/L (ref 3.5–5.2)
SODIUM: 141 mmol/L (ref 134–144)
Total Protein: 6.6 g/dL (ref 6.0–8.5)

## 2015-09-04 LAB — VITAMIN B12: Vitamin B-12: 89 pg/mL — ABNORMAL LOW (ref 211–946)

## 2015-09-04 LAB — TSH: TSH: 2.79 u[IU]/mL (ref 0.450–4.500)

## 2015-09-04 LAB — LIPID PANEL
CHOL/HDL RATIO: 3.3 ratio (ref 0.0–4.4)
Cholesterol, Total: 180 mg/dL (ref 100–199)
HDL: 54 mg/dL (ref >39)
LDL Calculated: 95 mg/dL (ref 0–99)
Triglycerides: 154 mg/dL — ABNORMAL HIGH (ref 0–149)
VLDL Cholesterol Cal: 31 mg/dL (ref 5–40)

## 2015-09-04 NOTE — Telephone Encounter (Signed)
-----   Message from Reubin MilanLaura H Berglund, MD sent at 09/04/2015  8:00 AM EDT ----- Very mild anemia - probably mixed iron and B12 deficiency.  All other labs are normal except very low B-12. Probably need to start getting injections every 2 weeks.

## 2015-09-04 NOTE — Telephone Encounter (Signed)
Left message for patient to call back  

## 2015-09-04 NOTE — Telephone Encounter (Signed)
Spoke with pt. Pt. Advised of all results and verbalized understanding.  ?

## 2015-09-06 LAB — PAP IG AND HPV HIGH-RISK
HPV, high-risk: NEGATIVE
PAP Smear Comment: 0

## 2015-09-17 ENCOUNTER — Ambulatory Visit (INDEPENDENT_AMBULATORY_CARE_PROVIDER_SITE_OTHER): Payer: BLUE CROSS/BLUE SHIELD

## 2015-09-17 DIAGNOSIS — E538 Deficiency of other specified B group vitamins: Secondary | ICD-10-CM | POA: Diagnosis not present

## 2015-10-06 ENCOUNTER — Ambulatory Visit: Payer: BLUE CROSS/BLUE SHIELD

## 2015-10-09 DIAGNOSIS — G4700 Insomnia, unspecified: Secondary | ICD-10-CM | POA: Diagnosis not present

## 2015-10-09 DIAGNOSIS — M549 Dorsalgia, unspecified: Secondary | ICD-10-CM | POA: Diagnosis not present

## 2015-10-09 DIAGNOSIS — D509 Iron deficiency anemia, unspecified: Secondary | ICD-10-CM | POA: Diagnosis not present

## 2015-10-09 DIAGNOSIS — Z9884 Bariatric surgery status: Secondary | ICD-10-CM | POA: Diagnosis not present

## 2015-10-09 DIAGNOSIS — M25471 Effusion, right ankle: Secondary | ICD-10-CM | POA: Diagnosis not present

## 2015-10-09 DIAGNOSIS — M542 Cervicalgia: Secondary | ICD-10-CM | POA: Diagnosis not present

## 2015-10-09 DIAGNOSIS — M25571 Pain in right ankle and joints of right foot: Secondary | ICD-10-CM | POA: Diagnosis not present

## 2015-10-09 DIAGNOSIS — Z882 Allergy status to sulfonamides status: Secondary | ICD-10-CM | POA: Diagnosis not present

## 2015-11-03 ENCOUNTER — Ambulatory Visit (INDEPENDENT_AMBULATORY_CARE_PROVIDER_SITE_OTHER): Payer: BLUE CROSS/BLUE SHIELD

## 2015-11-03 DIAGNOSIS — E538 Deficiency of other specified B group vitamins: Secondary | ICD-10-CM | POA: Diagnosis not present

## 2015-11-03 MED ORDER — CYANOCOBALAMIN 1000 MCG/ML IJ SOLN
1000.0000 ug | Freq: Once | INTRAMUSCULAR | Status: AC
  Administered 2015-11-03: 1000 ug via INTRAMUSCULAR

## 2015-11-18 ENCOUNTER — Ambulatory Visit: Payer: BLUE CROSS/BLUE SHIELD

## 2015-11-26 ENCOUNTER — Encounter: Payer: Self-pay | Admitting: Internal Medicine

## 2015-11-26 ENCOUNTER — Ambulatory Visit (INDEPENDENT_AMBULATORY_CARE_PROVIDER_SITE_OTHER): Payer: BLUE CROSS/BLUE SHIELD | Admitting: Internal Medicine

## 2015-11-26 VITALS — BP 124/82 | HR 77 | Resp 16 | Wt 210.0 lb

## 2015-11-26 DIAGNOSIS — R5383 Other fatigue: Secondary | ICD-10-CM

## 2015-11-26 DIAGNOSIS — E538 Deficiency of other specified B group vitamins: Secondary | ICD-10-CM

## 2015-11-26 DIAGNOSIS — D649 Anemia, unspecified: Secondary | ICD-10-CM

## 2015-11-26 DIAGNOSIS — K219 Gastro-esophageal reflux disease without esophagitis: Secondary | ICD-10-CM

## 2015-11-26 NOTE — Progress Notes (Signed)
Date:  11/26/2015   Name:  Christina Carlson   DOB:  09-26-1975   MRN:  [id number]   Chief Complaint: Fatigue HPI Patient presents today complaining of fatigue. She has no really specific complaints. No fevers chills chest pains or shortness of breath. She's not been too consistent getting her B12 injections. She does feel that she has adequate intake of protein. She does not drink as much water as she should. Her only specific complaint is a mild dull posterior headache that occurs several days a week. She has been under increased stress having to have her home treated for bedbugs. She feels that she sleeps well using Ambien. She does not awake feeling unrested nor have a history of snoring.   Review of Systems  Constitutional: Positive for appetite change and fatigue. Negative for fever and chills.  HENT: Negative for congestion, hearing loss, tinnitus, trouble swallowing and voice change.   Eyes: Negative for visual disturbance.  Respiratory: Negative for cough, chest tightness, shortness of breath and wheezing.   Cardiovascular: Negative for chest pain, palpitations and leg swelling.  Gastrointestinal: Negative for vomiting, abdominal pain, diarrhea and constipation.  Endocrine: Negative for polydipsia and polyuria.  Genitourinary: Negative for dysuria, frequency, vaginal bleeding, vaginal discharge and genital sores.  Musculoskeletal: Negative for joint swelling, arthralgias and gait problem.  Skin: Negative for color change and rash.  Neurological: Positive for headaches. Negative for dizziness, tremors and light-headedness.  Hematological: Negative for adenopathy. Does not bruise/bleed easily.  Psychiatric/Behavioral: Negative for sleep disturbance and dysphoric mood. The patient is not nervous/anxious.     Patient Active Problem List   Diagnosis Date Noted  . 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 Atypical squamous cells of undetermined significance on cytologic smear of cervix (ASC-US)  06/27/2015  . Vitamin B12 nutritional deficiency 05/07/2015  . LBP (low back pain) 09/16/2014  . Breast screening 09/16/2014  . Bariatric surgery status 09/16/2014  . Gastro-esophageal reflux disease without esophagitis 09/16/2014  . H/O motion sickness 09/16/2014  . Migraine without aura and responsive to treatment 09/16/2014  . Idiopathic insomnia 09/16/2014    Prior to Admission medications   Medication Sig Start Date End Date Taking? Authorizing Provider  drospirenone-ethinyl estradiol (YAZ,GIANVI,LORYNA) 3-0.02 MG tablet Take 1 tablet by mouth daily. 06/27/15  Yes Reubin MilanLaura H Eldo Umanzor, MD  zolpidem (AMBIEN) 10 MG tablet TAKE 1 TABLET BY MOUTH AT BEDTIME AS NEEDED FOR SLEEP 08/18/15  Yes Reubin MilanLaura H Renuka Farfan, MD  LORZONE 750 MG TABS Take 1 tablet by mouth 3 (three) times daily. 05/03/15   Historical Provider, MD  scopolamine (TRANSDERM-SCOP, 1.5 MG,) 1 MG/3DAYS Place 1 patch (1.5 mg total) onto the skin every 3 (three) days. 09/03/15   Reubin MilanLaura H Jaquila Santelli, MD    Allergies  Allergen Reactions  . Sulfa Antibiotics     Past Surgical History  Procedure Laterality Date  . Roux-en-y gastric bypass  2007  . Inguinal hernia repair  1981  . Tonsillectomy      Social History  Substance Use Topics  . Smoking status: Never Smoker   . Smokeless tobacco: None  . Alcohol Use: No     Medication list has been reviewed and updated.   Physical Exam  Constitutional: She is oriented to person, place, and time. She appears well-developed. No distress.  HENT:  Head: Normocephalic and atraumatic.  Neck: Normal range of motion. Neck supple.  Cardiovascular: Normal rate, regular rhythm and normal heart sounds.   Pulmonary/Chest: Effort normal and breath sounds normal. No  respiratory distress.  Musculoskeletal: Normal range of motion. She exhibits no edema or tenderness.       Cervical back: She exhibits spasm.  Neurological: She is alert and oriented to person, place, and time.  Skin: Skin is warm and  dry. No rash noted.  Psychiatric: She has a normal mood and affect. Her speech is normal and behavior is normal. Thought content normal.  Nursing note and vitals reviewed.   BP 124/82 mmHg  Pulse 77  Resp 16  Wt 210 lb (95.255 kg)  SpO2 100%  LMP 11/18/2015 (Approximate)  Assessment and Plan: 1. Vitamin B12 nutritional deficiency Check level and administer today - Vitamin B12  2. Gastro-esophageal reflux disease without esophagitis Check CBC  3. Anemia, unspecified anemia type - CBC with Differential/Platelet  4. Other fatigue Possibly related to recent stress Increase water intake, continue healthy diet Consider sleep study - CBC with Differential/Platelet - VITAMIN D 25 Hydroxy (Vit-D Deficiency, Fractures)   Bari EdwardLaura Leiliana Foody, MD [facility]  11/26/2015

## 2015-11-27 LAB — VITAMIN B12: Vitamin B-12: 103 pg/mL — ABNORMAL LOW (ref 211–946)

## 2015-11-27 LAB — CBC WITH DIFFERENTIAL/PLATELET
BASOS: 0 %
Basophils Absolute: 0 10*3/uL (ref 0.0–0.2)
EOS (ABSOLUTE): 0.1 10*3/uL (ref 0.0–0.4)
EOS: 2 %
HEMATOCRIT: 33.1 % — AB (ref 34.0–46.6)
HEMOGLOBIN: 10.8 g/dL — AB (ref 11.1–15.9)
IMMATURE GRANS (ABS): 0 10*3/uL (ref 0.0–0.1)
IMMATURE GRANULOCYTES: 0 %
Lymphocytes Absolute: 2.5 10*3/uL (ref 0.7–3.1)
Lymphs: 31 %
MCH: 25.5 pg — ABNORMAL LOW (ref 26.6–33.0)
MCHC: 32.6 g/dL (ref 31.5–35.7)
MCV: 78 fL — AB (ref 79–97)
MONOCYTES: 6 %
Monocytes Absolute: 0.5 10*3/uL (ref 0.1–0.9)
NEUTROS PCT: 61 %
Neutrophils Absolute: 4.9 10*3/uL (ref 1.4–7.0)
Platelets: 426 10*3/uL — ABNORMAL HIGH (ref 150–379)
RBC: 4.23 x10E6/uL (ref 3.77–5.28)
RDW: 15.1 % (ref 12.3–15.4)
WBC: 8.1 10*3/uL (ref 3.4–10.8)

## 2015-11-27 LAB — VITAMIN D 25 HYDROXY (VIT D DEFICIENCY, FRACTURES): Vit D, 25-Hydroxy: 43.1 ng/mL (ref 30.0–100.0)

## 2015-12-11 ENCOUNTER — Ambulatory Visit (INDEPENDENT_AMBULATORY_CARE_PROVIDER_SITE_OTHER): Payer: BLUE CROSS/BLUE SHIELD

## 2015-12-11 DIAGNOSIS — E538 Deficiency of other specified B group vitamins: Secondary | ICD-10-CM

## 2015-12-11 DIAGNOSIS — D519 Vitamin B12 deficiency anemia, unspecified: Secondary | ICD-10-CM

## 2015-12-11 MED ORDER — CYANOCOBALAMIN 1000 MCG/ML IJ SOLN
1000.0000 ug | Freq: Once | INTRAMUSCULAR | Status: AC
  Administered 2015-12-11: 1000 ug via INTRAMUSCULAR

## 2015-12-16 ENCOUNTER — Telehealth: Payer: Self-pay

## 2015-12-16 NOTE — Telephone Encounter (Signed)
Patient calling for refill Ambien

## 2015-12-25 ENCOUNTER — Ambulatory Visit (INDEPENDENT_AMBULATORY_CARE_PROVIDER_SITE_OTHER): Payer: BLUE CROSS/BLUE SHIELD

## 2015-12-25 DIAGNOSIS — E538 Deficiency of other specified B group vitamins: Secondary | ICD-10-CM

## 2016-01-08 ENCOUNTER — Ambulatory Visit: Payer: BLUE CROSS/BLUE SHIELD

## 2016-01-22 ENCOUNTER — Ambulatory Visit (INDEPENDENT_AMBULATORY_CARE_PROVIDER_SITE_OTHER): Payer: BLUE CROSS/BLUE SHIELD

## 2016-01-22 VITALS — Resp 16 | Ht 64.0 in | Wt 210.0 lb

## 2016-01-22 DIAGNOSIS — E538 Deficiency of other specified B group vitamins: Secondary | ICD-10-CM

## 2016-01-28 ENCOUNTER — Encounter: Payer: Self-pay | Admitting: Internal Medicine

## 2016-01-28 ENCOUNTER — Ambulatory Visit (INDEPENDENT_AMBULATORY_CARE_PROVIDER_SITE_OTHER): Payer: BLUE CROSS/BLUE SHIELD | Admitting: Internal Medicine

## 2016-01-28 VITALS — BP 118/84 | HR 70 | Ht 64.0 in | Wt 213.0 lb

## 2016-01-28 DIAGNOSIS — M6248 Contracture of muscle, other site: Secondary | ICD-10-CM

## 2016-01-28 DIAGNOSIS — M62838 Other muscle spasm: Secondary | ICD-10-CM

## 2016-01-28 DIAGNOSIS — S42102S Fracture of unspecified part of scapula, left shoulder, sequela: Secondary | ICD-10-CM

## 2016-01-28 MED ORDER — LORZONE 750 MG PO TABS
1.0000 | ORAL_TABLET | Freq: Four times a day (QID) | ORAL | 1 refills | Status: DC | PRN
Start: 2016-01-28 — End: 2016-06-19

## 2016-01-28 NOTE — Progress Notes (Signed)
Date:  01/28/2016   Name:  Christina Carlson   DOB:  12/20/1975   MRN:  [id number]   Chief Complaint: Shoulder Pain (had shoulder fx 4 yrs ago- was prescribed Lorzone by ortho, but can't get a call back- L) shoulder pain) Old injury to left scapula 4 years ago.  The fracture healed without treatment. Last year she was having more pain and muscle spasm so was seen at [facility] and given Lorzone. Xrays were normal at that time. Would like to have a refill on Lorzone. Using heat and rest.  No numbness or weakness in arms or hands.   Review of Systems  Constitutional: Negative for chills, fatigue and fever.  Respiratory: Negative for chest tightness and shortness of breath.   Cardiovascular: Negative for chest pain, palpitations and leg swelling.  Musculoskeletal: Positive for myalgias and neck pain.  Skin: Negative for rash.  Neurological: Negative for weakness, numbness and headaches.    Patient Active Problem List   Diagnosis Date Noted  . 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 Atypical squamous cells of undetermined significance on cytologic smear of cervix (ASC-US) 06/27/2015  . Vitamin B12 nutritional deficiency 05/07/2015  . LBP (low back pain) 09/16/2014  . Breast screening 09/16/2014  . Bariatric surgery status 09/16/2014  . Gastro-esophageal reflux disease without esophagitis 09/16/2014  . H/O motion sickness 09/16/2014  . Migraine without aura and responsive to treatment 09/16/2014  . Idiopathic insomnia 09/16/2014    Prior to Admission medications   Medication Sig Start Date End Date Taking? Authorizing Provider  drospirenone-ethinyl estradiol (YAZ,GIANVI,LORYNA) 3-0.02 MG tablet Take 1 tablet by mouth daily. 06/27/15  Yes [name], MD  LORZONE 750 MG TABS Take 1 tablet by mouth 4 (four) times daily as needed. ortho 12/29/15  Yes Historical Provider, MD  zolpidem (AMBIEN) 10 MG tablet TAKE 1 TABLET BY MOUTH AT BEDTIME AS NEEDED FOR SLEEP 08/18/15  Yes [name], MD    Allergies  Allergen Reactions  . Sulfa Antibiotics     Past Surgical History:  Procedure Laterality Date  . INGUINAL HERNIA REPAIR  1981  . ROUX-EN-Y GASTRIC BYPASS  2007  . TONSILLECTOMY      Social History  Substance Use Topics  . Smoking status: Never Smoker  . Smokeless tobacco: Not on file  . Alcohol use No     Medication list has been reviewed and updated.   Physical Exam  Constitutional: She is oriented to person, place, and time. She appears well-developed. No distress.  HENT:  Head: Normocephalic and atraumatic.  Pulmonary/Chest: Effort normal. No respiratory distress.  Musculoskeletal:       Cervical back: She exhibits decreased range of motion, tenderness and spasm.  Neurological: She is alert and oriented to person, place, and time. She has normal strength. No sensory deficit.  Skin: Skin is warm and dry. No rash noted.  Psychiatric: She has a normal mood and affect. Her behavior is normal. Thought content normal.  Nursing note and vitals reviewed.   BP 118/84   Pulse 70   Ht 5\' 4"  (1.626 m)   Wt 213 lb (96.6 kg)   LMP 12/28/2015 (Approximate)   BMI 36.56 kg/m   Assessment and Plan: 1. Scapula fracture, left, sequela Healed   2. muscle spasm of neck and shoulder Associated spasm possibly as a results of previous injury Continue heat and tylenol - LORZONE 750 MG TABS; Take 1 tablet by mouth 4 (four) times daily as needed. ortho  Dispense: 60 tablet; Refill: 1   Bari EdwardLaura Estefania Kamiya, MD [facility]  01/28/2016

## 2016-02-05 ENCOUNTER — Ambulatory Visit (INDEPENDENT_AMBULATORY_CARE_PROVIDER_SITE_OTHER): Payer: BLUE CROSS/BLUE SHIELD

## 2016-02-05 DIAGNOSIS — D519 Vitamin B12 deficiency anemia, unspecified: Secondary | ICD-10-CM

## 2016-02-05 MED ORDER — CYANOCOBALAMIN 1000 MCG/ML IJ SOLN
1000.0000 ug | Freq: Once | INTRAMUSCULAR | Status: AC
  Administered 2016-02-05: 1000 ug via INTRAMUSCULAR

## 2016-02-10 ENCOUNTER — Other Ambulatory Visit: Payer: Self-pay | Admitting: Internal Medicine

## 2016-02-10 MED ORDER — ZOLPIDEM TARTRATE 10 MG PO TABS: 10.0000 mg | ORAL_TABLET | Freq: Every evening | ORAL | 5 refills | Status: DC | PRN

## 2016-03-01 ENCOUNTER — Ambulatory Visit (INDEPENDENT_AMBULATORY_CARE_PROVIDER_SITE_OTHER): Payer: BLUE CROSS/BLUE SHIELD

## 2016-03-01 VITALS — Resp 16 | Ht 64.0 in | Wt 213.0 lb

## 2016-03-01 DIAGNOSIS — L539 Erythematous condition, unspecified: Secondary | ICD-10-CM | POA: Diagnosis not present

## 2016-03-01 DIAGNOSIS — E538 Deficiency of other specified B group vitamins: Secondary | ICD-10-CM | POA: Diagnosis not present

## 2016-03-16 ENCOUNTER — Ambulatory Visit (INDEPENDENT_AMBULATORY_CARE_PROVIDER_SITE_OTHER): Payer: BLUE CROSS/BLUE SHIELD

## 2016-03-16 VITALS — Resp 16 | Ht 64.0 in | Wt 213.0 lb

## 2016-03-16 DIAGNOSIS — E538 Deficiency of other specified B group vitamins: Secondary | ICD-10-CM | POA: Diagnosis not present

## 2016-03-16 DIAGNOSIS — Z23 Encounter for immunization: Secondary | ICD-10-CM

## 2016-03-16 NOTE — Patient Instructions (Signed)
Influenza (Flu) Vaccine (Inactivated or Recombinant):  1. Why get vaccinated? Influenza ("flu") is a contagious disease that spreads around the [place] every year, usually between October and May. Flu is caused by influenza viruses, and is spread mainly by coughing, sneezing, and close contact. Anyone can get flu. Flu strikes suddenly and can last several days. Symptoms vary by age, but can include:  fever/chills  sore throat  muscle aches fatigueCyanocobalamin, Vitamin B12 injection What is this medicine? CYANOCOBALAMIN (sye an oh koe BAL a min) is a man made form of vitamin B12. Vitamin B12 is used in the growth of healthy blood cells, nerve cells, and proteins in the body. It also helps with the metabolism of fats and carbohydrates. This medicine is used to treat people who can not absorb vitamin B12. This medicine may be used for other purposes; ask your health care provider or pharmacist if you have questions. What should I tell my health care provider before I take this medicine? They need to know if you have any of these conditions: -kidney disease -Leber's disease -megaloblastic anemia -an unusual or allergic reaction to cyanocobalamin, cobalt, other medicines, foods, dyes, or preservatives -pregnant or trying to get pregnant -breast-feeding How should I use this medicine? This medicine is injected into a muscle or deeply under the skin. It is usually given by a health care professional in a clinic or doctor's office. However, your doctor may teach you how to inject yourself. Follow all instructions. Talk to your pediatrician regarding the use of this medicine in children. Special care may be needed. Overdosage: If you think you have taken too much of this medicine contact a poison control center or emergency room at once. NOTE: This medicine is only for you. Do not share this medicine with others. What if I miss a dose? If you are given your dose at a clinic or doctor's  office, call to reschedule your appointment. If you give your own injections and you miss a dose, take it as soon as you can. If it is almost time for your next dose, take only that dose. Do not take double or extra doses. What may interact with this medicine? -colchicine -heavy alcohol intake This list may not describe all possible interactions. Give your health care provider a list of all the medicines, herbs, non-prescription drugs, or dietary supplements you use. Also tell them if you smoke, drink alcohol, or use illegal drugs. Some items may interact with your medicine. What should I watch for while using this medicine? Visit your doctor or health care professional regularly. You may need blood work done while you are taking this medicine. You may need to follow a special diet. Talk to your doctor. Limit your alcohol intake and avoid smoking to get the best benefit. What side effects may I notice from receiving this medicine? Side effects that you should report to your doctor or health care professional as soon as possible: -allergic reactions like skin rash, itching or hives, swelling of the face, lips, or tongue -blue tint to skin -chest tightness, pain -difficulty breathing, wheezing -dizziness -red, swollen painful area on the leg Side effects that usually do not require medical attention (report to your doctor or health care professional if they continue or are bothersome): -diarrhea -headache This list may not describe all possible side effects. Call your doctor for medical advice about side effects. You may report side effects to FDA at 1-800-FDA-1088. Where should I keep my medicine? Keep out of the  reach of children. Store at room temperature between 15 and 30 degrees C (59 and 85 degrees F). Protect from light. Throw away any unused medicine after the expiration date. NOTE: This sheet is a summary. It may not cover all possible information. If you have questions about this  medicine, talk to your doctor, pharmacist, or health care provider.    2016, Elsevier/Gold Standard. (2007-08-28 22:10:20)    headache  runny or stuffy nose Flu can also lead to pneumonia and blood infections, and cause diarrhea and seizures in children. If you have a medical condition, such as heart or lung disease, flu can make it worse. Flu is more dangerous for some people. Infants and young children, people 60 years of age and older, pregnant women, and people with certain health conditions or a weakened immune system are at greatest risk. Each year thousands of people in the [place] die from flu, and many more are hospitalized. Flu vaccine can:  keep you from getting flu,  make flu less severe if you do get it, and  keep you from spreading flu to your family and other people. 2. Inactivated and recombinant flu vaccines A dose of flu vaccine is recommended every flu season. Children 6 months through 53 years of age may need two doses during the same flu season. Everyone else needs only one dose each flu season. Some inactivated flu vaccines contain a very small amount of a mercury-based preservative called thimerosal. Studies have not shown thimerosal in vaccines to be harmful, but flu vaccines that do not contain thimerosal are available. There is no live flu virus in flu shots. They cannot cause the flu. There are many flu viruses, and they are always changing. Each year a new flu vaccine is made to protect against three or four viruses that are likely to cause disease in the upcoming flu season. But even when the vaccine doesn't exactly match these viruses, it may still provide some protection. Flu vaccine cannot prevent:  flu that is caused by a virus not covered by the vaccine, or  illnesses that look like flu but are not. It takes about 2 weeks for protection to develop after vaccination, and protection lasts through the flu season. 3. Some people should not get this  vaccine Tell the person who is giving you the vaccine:  If you have any severe, life-threatening allergies. If you ever had a life-threatening allergic reaction after a dose of flu vaccine, or have a severe allergy to any part of this vaccine, you may be advised not to get vaccinated. Most, but not all, types of flu vaccine contain a small amount of egg protein.  If you ever had Guillain-Barre Syndrome (also called GBS). Some people with a history of GBS should not get this vaccine. This should be discussed with your doctor.  If you are not feeling well. It is usually okay to get flu vaccine when you have a mild illness, but you might be asked to come back when you feel better. 4. Risks of a vaccine reaction With any medicine, including vaccines, there is a chance of reactions. These are usually mild and go away on their own, but serious reactions are also possible. Most people who get a flu shot do not have any problems with it. Minor problems following a flu shot include:  soreness, redness, or swelling where the shot was given  hoarseness  sore, red or itchy eyes  cough  fever  aches  headache  itching  fatigue If these problems occur, they usually begin soon after the shot and last 1 or 2 days. More serious problems following a flu shot can include the following:  There may be a small increased risk of Guillain-Barre Syndrome (GBS) after inactivated flu vaccine. This risk has been estimated at 1 or 2 additional cases per million people vaccinated. This is much lower than the risk of severe complications from flu, which can be prevented by flu vaccine.  Young children who get the flu shot along with pneumococcal vaccine (PCV13) and/or DTaP vaccine at the same time might be slightly more likely to have a seizure caused by fever. Ask your doctor for more information. Tell your doctor if a child who is getting flu vaccine has ever had a seizure. Problems that could happen after  any injected vaccine:  People sometimes faint after a medical procedure, including vaccination. Sitting or lying down for about 15 minutes can help prevent fainting, and injuries caused by a fall. Tell your doctor if you feel dizzy, or have vision changes or ringing in the ears.  Some people get severe pain in the shoulder and have difficulty moving the arm where a shot was given. This happens very rarely.  Any medication can cause a severe allergic reaction. Such reactions from a vaccine are very rare, estimated at about 1 in a million doses, and would happen within a few minutes to a few hours after the vaccination. As with any medicine, there is a very remote chance of a vaccine causing a serious injury or death. The safety of vaccines is always being monitored. For more information, visit: http://floyd.org/ 5. What if there is a serious reaction? What should I look for?  Look for anything that concerns you, such as signs of a severe allergic reaction, very high fever, or unusual behavior. Signs of a severe allergic reaction can include hives, swelling of the face and throat, difficulty breathing, a fast heartbeat, dizziness, and weakness. These would start a few minutes to a few hours after the vaccination. What should I do?  If you think it is a severe allergic reaction or other emergency that can't wait, call 9-1-1 and get the person to the nearest hospital. Otherwise, call your doctor.  Reactions should be reported to the Vaccine Adverse Event Reporting System (VAERS). Your doctor should file this report, or you can do it yourself through the VAERS web site at www.vaers.LAgents.no, or by calling 1-(321) 122-4047. VAERS does not give medical advice. 6. The National Vaccine Injury Compensation Program The [employer] Vaccine Injury Compensation Program (VICP) is a federal program that was created to compensate people who may have been injured by certain vaccines. Persons who believe they  may have been injured by a vaccine can learn about the program and about filing a claim by calling 1-610-231-2416 or visiting the VICP website at SpiritualWord.at. There is a time limit to file a claim for compensation. 7. How can I learn more?  Ask your healthcare provider. He or she can give you the vaccine package insert or suggest other sources of information.  Call your local or state health department.  Contact the Centers for Disease Control and Prevention (CDC):  Call 305-394-2279 (1-800-CDC-INFO) or  Visit CDC's website at BiotechRoom.com.cy Vaccine Information Statement Inactivated Influenza Vaccine (01/04/2014)   This information is not intended to replace advice given to you by your health care provider. Make sure you discuss any questions you have with your health care provider.   Document Released:  03/11/2006 Document Revised: 06/07/2014 Document Reviewed: 01/07/2014 Elsevier Interactive Patient Education [employer]

## 2016-03-17 ENCOUNTER — Telehealth: Payer: BLUE CROSS/BLUE SHIELD | Admitting: Family

## 2016-03-17 DIAGNOSIS — B9789 Other viral agents as the cause of diseases classified elsewhere: Secondary | ICD-10-CM

## 2016-03-17 DIAGNOSIS — J329 Chronic sinusitis, unspecified: Secondary | ICD-10-CM

## 2016-03-17 MED ORDER — FLUTICASONE PROPIONATE 50 MCG/ACT NA SUSP: 2.0000 | Freq: Every day | NASAL | 6 refills | Status: DC

## 2016-03-17 NOTE — Progress Notes (Signed)

## 2016-03-18 ENCOUNTER — Telehealth: Payer: Self-pay

## 2016-03-18 NOTE — Telephone Encounter (Signed)
She will need to continue the current therapy at least through the weekend.  If she still thinks she needs an antibiotic, she will need to be seen.

## 2016-03-18 NOTE — Telephone Encounter (Signed)
Advised and suggested to add allergy pill and sleep with head elevated to help with fluid in ear. If fever or worsening will need to be seen for Abx.

## 2016-03-18 NOTE — Telephone Encounter (Signed)
Patient did E visit for Sinus issues. She was given Flonase and feels she has Sinus Inf and need ABX [facility] [place] [facility].

## 2016-03-30 ENCOUNTER — Ambulatory Visit (INDEPENDENT_AMBULATORY_CARE_PROVIDER_SITE_OTHER): Payer: BLUE CROSS/BLUE SHIELD

## 2016-03-30 DIAGNOSIS — E538 Deficiency of other specified B group vitamins: Secondary | ICD-10-CM

## 2016-04-13 ENCOUNTER — Ambulatory Visit: Payer: BLUE CROSS/BLUE SHIELD

## 2016-04-14 ENCOUNTER — Ambulatory Visit (INDEPENDENT_AMBULATORY_CARE_PROVIDER_SITE_OTHER): Payer: BLUE CROSS/BLUE SHIELD

## 2016-04-14 VITALS — Resp 16 | Ht 64.0 in | Wt 213.0 lb

## 2016-04-14 DIAGNOSIS — E538 Deficiency of other specified B group vitamins: Secondary | ICD-10-CM | POA: Diagnosis not present

## 2016-04-15 ENCOUNTER — Encounter: Payer: Self-pay | Admitting: Internal Medicine

## 2016-04-15 ENCOUNTER — Ambulatory Visit (INDEPENDENT_AMBULATORY_CARE_PROVIDER_SITE_OTHER): Payer: BLUE CROSS/BLUE SHIELD | Admitting: Internal Medicine

## 2016-04-15 VITALS — BP 122/64 | HR 68 | Ht 64.0 in | Wt 225.0 lb

## 2016-04-15 DIAGNOSIS — N3000 Acute cystitis without hematuria: Secondary | ICD-10-CM | POA: Diagnosis not present

## 2016-04-15 LAB — POC URINALYSIS WITH MICROSCOPIC (NON AUTO)MANUAL RESULT
Bilirubin, UA: NEGATIVE
CRYSTALS: 0
Epithelial cells, urine per micros: 0
GLUCOSE UA: NEGATIVE
Ketones, UA: NEGATIVE
MUCUS UA: 0
Nitrite, UA: POSITIVE
Protein, UA: NEGATIVE
RBC: 0 M/uL — AB (ref 4.04–5.48)
SPEC GRAV UA: 1.02
Urobilinogen, UA: 0.2
WBC Casts, UA: 10
pH, UA: 6

## 2016-04-15 MED ORDER — CIPROFLOXACIN HCL 250 MG PO TABS: 250.0000 mg | ORAL_TABLET | Freq: Two times a day (BID) | ORAL | 0 refills | Status: DC

## 2016-04-15 NOTE — Progress Notes (Signed)
    Date:  04/15/2016   Name:  Christina Carlson   DOB:  09-16-1975   MRN:  [id number]   Chief Complaint: Dysuria (2-5 days)  Dysuria   This is a new problem. The current episode started in the past 7 days. The problem occurs every urination. Associated symptoms include frequency and urgency. Pertinent negatives include no chills, hematuria or vomiting.     Review of Systems  Constitutional: Negative for chills, fatigue and fever.  Respiratory: Negative for chest tightness and shortness of breath.   Gastrointestinal: Negative for abdominal pain, constipation, diarrhea and vomiting.  Genitourinary: Positive for dysuria, frequency and urgency. Negative for hematuria.    Patient Active Problem List   Diagnosis Date Noted  . 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 Atypical squamous cells of undetermined significance on cytologic smear of cervix (ASC-US) 06/27/2015  . Vitamin B12 nutritional deficiency 05/07/2015  . LBP (low back pain) 09/16/2014  . Breast screening 09/16/2014  . Bariatric surgery status 09/16/2014  . Gastro-esophageal reflux disease without esophagitis 09/16/2014  . H/O motion sickness 09/16/2014  . Migraine without aura and responsive to treatment 09/16/2014  . Idiopathic insomnia 09/16/2014    Prior to Admission medications   Medication Sig Start Date End Date Taking? Authorizing Provider  drospirenone-ethinyl estradiol (YAZ,GIANVI,LORYNA) 3-0.02 MG tablet Take 1 tablet by mouth daily. 06/27/15  Yes Reubin MilanLaura H Clyde Upshaw, MD  fluticasone (FLONASE) 50 MCG/ACT nasal spray Place 2 sprays into both nostrils daily. 03/17/16  Yes Christy A Hawks, FNP  LORZONE 750 MG TABS Take 1 tablet by mouth 4 (four) times daily as needed. ortho 01/28/16  Yes Reubin MilanLaura H Quantel Mcinturff, MD  zolpidem (AMBIEN) 10 MG tablet Take 1 tablet (10 mg total) by mouth at bedtime as needed. for sleep 02/10/16  Yes Reubin MilanLaura H Brannon Levene, MD    Allergies  Allergen Reactions  . Sulfa Antibiotics     Past Surgical History:    Procedure Laterality Date  . INGUINAL HERNIA REPAIR  1981  . ROUX-EN-Y GASTRIC BYPASS  2007  . TONSILLECTOMY      Social History  Substance Use Topics  . Smoking status: Never Smoker  . Smokeless tobacco: Never Used  . Alcohol use No     Medication list has been reviewed and updated.   Physical Exam  Constitutional: She appears well-developed and well-nourished.  Cardiovascular: Normal rate, regular rhythm and normal heart sounds.   Pulmonary/Chest: Effort normal and breath sounds normal. No respiratory distress.  Abdominal: Soft. Bowel sounds are normal. There is tenderness in the suprapubic area. There is no rebound, no guarding and no CVA tenderness.  Psychiatric: She has a normal mood and affect.  Nursing note and vitals reviewed.   BP 122/64   Pulse 68   Ht 5\' 4"  (1.626 m)   Wt 225 lb (102.1 kg)   BMI 38.62 kg/m   Assessment and Plan: 1. Acute cystitis without hematuria Continue increased fluids Return if needed - POC urinalysis w microscopic (non auto) - ciprofloxacin (CIPRO) 250 MG tablet; Take 1 tablet (250 mg total) by mouth 2 (two) times daily.  Dispense: 14 tablet; Refill: 0   Bari EdwardLaura Yida Hyams, MD [facility]  04/15/2016

## 2016-04-28 ENCOUNTER — Ambulatory Visit (INDEPENDENT_AMBULATORY_CARE_PROVIDER_SITE_OTHER): Payer: BLUE CROSS/BLUE SHIELD

## 2016-04-28 VITALS — Resp 16 | Ht 64.0 in | Wt 225.0 lb

## 2016-04-28 DIAGNOSIS — E538 Deficiency of other specified B group vitamins: Secondary | ICD-10-CM | POA: Diagnosis not present

## 2016-04-30 ENCOUNTER — Telehealth: Payer: Self-pay | Admitting: Internal Medicine

## 2016-05-01 NOTE — Telephone Encounter (Signed)
B12 administered

## 2016-05-03 NOTE — Telephone Encounter (Signed)
Advised 

## 2016-06-04 ENCOUNTER — Encounter: Payer: Self-pay | Admitting: Internal Medicine

## 2016-06-04 ENCOUNTER — Ambulatory Visit (INDEPENDENT_AMBULATORY_CARE_PROVIDER_SITE_OTHER): Payer: BLUE CROSS/BLUE SHIELD | Admitting: Internal Medicine

## 2016-06-04 VITALS — BP 116/82 | HR 77 | Temp 97.7°F | Ht 64.0 in | Wt 212.0 lb

## 2016-06-04 DIAGNOSIS — J0100 Acute maxillary sinusitis, unspecified: Secondary | ICD-10-CM

## 2016-06-04 MED ORDER — AMOXICILLIN-POT CLAVULANATE 875-125 MG PO TABS: 1.0000 | ORAL_TABLET | Freq: Two times a day (BID) | ORAL | 0 refills | Status: DC

## 2016-06-04 MED ORDER — GUAIFENESIN-CODEINE 100-10 MG/5ML PO SYRP: 5.0000 mL | ORAL_SOLUTION | Freq: Three times a day (TID) | ORAL | 0 refills | Status: DC | PRN

## 2016-06-04 NOTE — Progress Notes (Signed)
Date:  06/04/2016   Name:  Christina MadeiraCristy L Oconnor   DOB:  10/06/1975   MRN:  [id number]   Chief Complaint: Sinus Problem (Pt stated coughing yellow mucus, sinus problem for 1 week) Sinus Problem  This is a new problem. The current episode started in the past 7 days. The problem has been gradually worsening since onset. There has been no fever. The pain is mild. Associated symptoms include congestion, coughing, ear pain, sinus pressure and a sore throat. Pertinent negatives include no chills, hoarse voice or shortness of breath. Past treatments include spray decongestants, saline sprays and acetaminophen. The treatment provided mild relief.      Review of Systems  Constitutional: Negative for chills, fatigue and fever.  HENT: Positive for congestion, ear pain, sinus pressure and sore throat. Negative for hoarse voice.   Respiratory: Positive for cough. Negative for shortness of breath.   Cardiovascular: Negative for chest pain.  Gastrointestinal: Negative for nausea and vomiting.  Neurological: Negative for dizziness, tremors and light-headedness.    Patient Active Problem List   Diagnosis Date Noted  . 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 Atypical squamous cells of undetermined significance on cytologic smear of cervix (ASC-US) 06/27/2015  . Vitamin B12 nutritional deficiency 05/07/2015  . LBP (low back pain) 09/16/2014  . Breast screening 09/16/2014  . Bariatric surgery status 09/16/2014  . Gastro-esophageal reflux disease without esophagitis 09/16/2014  . H/O motion sickness 09/16/2014  . Migraine without aura and responsive to treatment 09/16/2014  . Idiopathic insomnia 09/16/2014    Prior to Admission medications   Medication Sig Start Date End Date Taking? Authorizing Provider  drospirenone-ethinyl estradiol (YAZ,GIANVI,LORYNA) 3-0.02 MG tablet Take 1 tablet by mouth daily. 06/27/15  Yes Reubin MilanLaura H Macarthur Lorusso, MD  fluticasone (FLONASE) 50 MCG/ACT nasal spray Place 2 sprays into both  nostrils daily. 03/17/16  Yes Christy A Hawks, FNP  LORZONE 750 MG TABS Take 1 tablet by mouth 4 (four) times daily as needed. ortho 01/28/16  Yes Reubin MilanLaura H Betsabe Iglesia, MD  zolpidem (AMBIEN) 10 MG tablet Take 1 tablet (10 mg total) by mouth at bedtime as needed. for sleep 02/10/16  Yes Reubin MilanLaura H Omero Kowal, MD    Allergies  Allergen Reactions  . Sulfa Antibiotics     Past Surgical History:  Procedure Laterality Date  . INGUINAL HERNIA REPAIR  1981  . ROUX-EN-Y GASTRIC BYPASS  2007  . TONSILLECTOMY      Social History  Substance Use Topics  . Smoking status: Never Smoker  . Smokeless tobacco: Never Used  . Alcohol use No     Medication list has been reviewed and updated.   Physical Exam  Constitutional: She is oriented to person, place, and time. She appears well-developed and well-nourished.  HENT:  Right Ear: External ear and ear canal normal. Tympanic membrane is erythematous and retracted.  Left Ear: External ear and ear canal normal. Tympanic membrane is retracted. Tympanic membrane is not erythematous.  Nose: Right sinus exhibits maxillary sinus tenderness and frontal sinus tenderness. Left sinus exhibits maxillary sinus tenderness and frontal sinus tenderness.  Mouth/Throat: Uvula is midline and mucous membranes are normal. No oral lesions. No oropharyngeal exudate or posterior oropharyngeal erythema.  Cardiovascular: Normal rate, regular rhythm and normal heart sounds.   Pulmonary/Chest: Breath sounds normal. She has no wheezes. She has no rales.  Lymphadenopathy:    She has no cervical adenopathy.  Neurological: She is alert and oriented to person, place, and time.    BP 116/82   Pulse  77   Temp 97.7 F (36.5 C)   Ht 5\' 4"  (1.626 m)   Wt 212 lb (96.2 kg)   SpO2 96%   BMI 36.39 kg/m   Assessment and Plan: 1. Acute non-recurrent maxillary sinusitis Continue Flonase, fluids, rest - amoxicillin-clavulanate (AUGMENTIN) 875-125 MG tablet; Take 1 tablet by mouth 2 (two)  times daily.  Dispense: 20 tablet; Refill: 0 - guaiFENesin-codeine (ROBITUSSIN AC) 100-10 MG/5ML syrup; Take 5 mLs by mouth 3 (three) times daily as needed for cough.  Dispense: 150 mL; Refill: 0   Bari Edward, MD [facility]  06/04/2016

## 2016-06-04 NOTE — Patient Instructions (Signed)

## 2016-06-19 ENCOUNTER — Other Ambulatory Visit: Payer: Self-pay | Admitting: Internal Medicine

## 2016-07-04 ENCOUNTER — Other Ambulatory Visit: Payer: Self-pay | Admitting: Internal Medicine

## 2016-07-04 ENCOUNTER — Encounter: Payer: Self-pay | Admitting: Internal Medicine

## 2016-07-04 DIAGNOSIS — Z30011 Encounter for initial prescription of contraceptive pills: Secondary | ICD-10-CM

## 2016-07-05 ENCOUNTER — Other Ambulatory Visit: Payer: Self-pay | Admitting: Internal Medicine

## 2016-07-05 ENCOUNTER — Telehealth: Payer: Self-pay | Admitting: Internal Medicine

## 2016-07-05 MED ORDER — AMOXICILLIN-POT CLAVULANATE 875-125 MG PO TABS: ORAL_TABLET | ORAL | 0 refills | Status: DC

## 2016-07-05 NOTE — Telephone Encounter (Signed)
Pt called stated having sinus problem again and [place] [facility] [place]

## 2016-07-18 ENCOUNTER — Other Ambulatory Visit: Payer: Self-pay | Admitting: Internal Medicine

## 2016-07-19 ENCOUNTER — Other Ambulatory Visit: Payer: Self-pay | Admitting: Internal Medicine

## 2016-07-21 ENCOUNTER — Other Ambulatory Visit: Payer: Self-pay | Admitting: Internal Medicine

## 2016-07-21 ENCOUNTER — Telehealth: Payer: Self-pay

## 2016-07-21 ENCOUNTER — Ambulatory Visit (INDEPENDENT_AMBULATORY_CARE_PROVIDER_SITE_OTHER): Payer: BLUE CROSS/BLUE SHIELD | Admitting: Internal Medicine

## 2016-07-21 ENCOUNTER — Encounter: Payer: Self-pay | Admitting: Internal Medicine

## 2016-07-21 VITALS — BP 112/80 | HR 74 | Temp 97.8°F | Ht 64.0 in | Wt 218.0 lb

## 2016-07-21 DIAGNOSIS — J111 Influenza due to unidentified influenza virus with other respiratory manifestations: Secondary | ICD-10-CM

## 2016-07-21 DIAGNOSIS — R69 Illness, unspecified: Secondary | ICD-10-CM

## 2016-07-21 MED ORDER — HYDROCOD POLST-CPM POLST ER 10-8 MG/5ML PO SUER: 5.0000 mL | Freq: Two times a day (BID) | ORAL | 0 refills | Status: DC

## 2016-07-21 MED ORDER — ZOLPIDEM TARTRATE 10 MG PO TABS: 10.0000 mg | ORAL_TABLET | Freq: Every evening | ORAL | 5 refills | Status: DC | PRN

## 2016-07-21 MED ORDER — OSELTAMIVIR PHOSPHATE 75 MG PO CAPS: 75.0000 mg | ORAL_CAPSULE | Freq: Two times a day (BID) | ORAL | 0 refills | Status: DC

## 2016-07-21 NOTE — Telephone Encounter (Signed)
Pt called requesting AMBIEN 10 mg to be sent to [facility] instead of [facility]. Changed pharmacy in her chart.

## 2016-07-21 NOTE — Progress Notes (Signed)
Date:  07/21/2016   Name:  Christina Carlson   DOB:  04/09/1976   MRN:  [id number]   Chief Complaint: Sore Throat ("scratchy throat". No congestion. Body aches w/ chills. Pt states she can't get warm. No fever.) Sore Throat   This is a new problem. The current episode started today. There has been no fever. Associated symptoms include abdominal pain, coughing, headaches and a hoarse voice. Pertinent negatives include no diarrhea. She has tried NSAIDs for the symptoms.  Also has chills, headache, dry cough and fatigue.    Review of Systems  Constitutional: Positive for chills and fatigue.  HENT: Positive for hoarse voice and sore throat.   Eyes: Negative for visual disturbance.  Respiratory: Positive for cough. Negative for chest tightness and wheezing.   Cardiovascular: Negative for chest pain and palpitations.  Gastrointestinal: Positive for abdominal pain. Negative for constipation and diarrhea.  Musculoskeletal: Negative for arthralgias.  Neurological: Positive for headaches. Negative for dizziness.    Patient Active Problem List   Diagnosis Date Noted  . 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 Atypical squamous cells of undetermined significance on cytologic smear of cervix (ASC-US) 06/27/2015  . Vitamin B12 nutritional deficiency 05/07/2015  . LBP (low back pain) 09/16/2014  . Breast screening 09/16/2014  . Bariatric surgery status 09/16/2014  . Gastro-esophageal reflux disease without esophagitis 09/16/2014  . H/O motion sickness 09/16/2014  . Migraine without aura and responsive to treatment 09/16/2014  . Idiopathic insomnia 09/16/2014    Prior to Admission medications   Medication Sig Start Date End Date Taking? Authorizing Provider  fluticasone (FLONASE) 50 MCG/ACT nasal spray Place 2 sprays into both nostrils daily. 03/17/16  Yes Junie Spencerhristy A Hawks, FNP  LORZONE 750 MG TABS TAKE 1 TABLET BY MOUTH FOUR TIMES DAILY AS NEEDED 07/19/16  Yes Reubin MilanLaura H Berglund, MD  NIKKI 3-0.02 MG  tablet TAKE 1 TABLET BY MOUTH DAILY 07/04/16  Yes Reubin MilanLaura H Berglund, MD  zolpidem (AMBIEN) 10 MG tablet TAKE 1 TABLET BY MOUTH EVERY NIGHT AT BEDTIME AS NEEDED FOR SLEEP 07/19/16  Yes Reubin MilanLaura H Berglund, MD    Allergies  Allergen Reactions  . Sulfa Antibiotics     Past Surgical History:  Procedure Laterality Date  . INGUINAL HERNIA REPAIR  1981  . ROUX-EN-Y GASTRIC BYPASS  2007  . TONSILLECTOMY      Social History  Substance Use Topics  . Smoking status: Never Smoker  . Smokeless tobacco: Never Used  . Alcohol use No     Medication list has been reviewed and updated.   Physical Exam  Constitutional: She is oriented to person, place, and time. She appears well-developed. She has a sickly appearance. No distress.  HENT:  Head: Normocephalic and atraumatic.  Right Ear: Tympanic membrane and ear canal normal.  Left Ear: Tympanic membrane and ear canal normal.  Nose: Right sinus exhibits no maxillary sinus tenderness. Left sinus exhibits no maxillary sinus tenderness.  Mouth/Throat: No posterior oropharyngeal erythema.  Cardiovascular: Normal rate and regular rhythm.   Pulmonary/Chest: Effort normal and breath sounds normal. No respiratory distress.  Musculoskeletal: Normal range of motion.  Neurological: She is alert and oriented to person, place, and time.  Skin: Skin is warm and dry. No rash noted.  Psychiatric: She has a normal mood and affect. Her behavior is normal. Thought content normal.  Nursing note and vitals reviewed.   BP 112/80   Pulse 74   Temp 97.8 F (36.6 C)   Ht 5\' 4"  (1.626 m)  Wt 218 lb (98.9 kg)   LMP 07/01/2016 (Within Days)   SpO2 100%   BMI 37.42 kg/m   Assessment and Plan: 1. Influenza-like illness Suspect early influenza Rxs given for pt to fill if worsening Home with rest, fluids and Advil  Meds ordered this encounter  Medications  . oseltamivir (TAMIFLU) 75 MG capsule    Sig: Take 1 capsule (75 mg total) by mouth 2 (two) times daily.      Dispense:  10 capsule    Refill:  0  . chlorpheniramine-HYDROcodone (TUSSIONEX PENNKINETIC ER) 10-8 MG/5ML SUER    Sig: Take 5 mLs by mouth 2 (two) times daily.    Dispense:  115 mL    Refill:  0    Bari Edward, MD [facility]  07/21/2016

## 2016-07-21 NOTE — Telephone Encounter (Signed)
RX written and ready to be faxed.

## 2016-07-22 NOTE — Telephone Encounter (Signed)
RX faxed and sent through [place].

## 2016-09-04 ENCOUNTER — Other Ambulatory Visit: Payer: Self-pay | Admitting: Internal Medicine

## 2017-01-05 ENCOUNTER — Other Ambulatory Visit: Payer: Self-pay | Admitting: Internal Medicine

## 2017-01-05 ENCOUNTER — Telehealth: Payer: Self-pay

## 2017-01-05 MED ORDER — ZOLPIDEM TARTRATE 10 MG PO TABS: 10.0000 mg | ORAL_TABLET | Freq: Every evening | ORAL | 5 refills | Status: DC | PRN

## 2017-01-05 NOTE — Telephone Encounter (Signed)
Printed and ready to be faxed. 

## 2017-01-05 NOTE — Telephone Encounter (Signed)
Patient requesting refill on zolpidem (AMBIEN) 10 mg- 30 tabs send to [facility] in [place]. Pt appointment is coming up on 01/10/2017. Please Advise.

## 2017-01-10 ENCOUNTER — Encounter: Payer: Self-pay | Admitting: Internal Medicine

## 2017-01-10 ENCOUNTER — Ambulatory Visit (INDEPENDENT_AMBULATORY_CARE_PROVIDER_SITE_OTHER): Payer: BLUE CROSS/BLUE SHIELD | Admitting: Internal Medicine

## 2017-01-10 VITALS — BP 124/80 | HR 93 | Temp 98.0°F | Resp 14 | Ht 64.0 in | Wt 221.0 lb

## 2017-01-10 DIAGNOSIS — Z020 Encounter for examination for admission to educational institution: Secondary | ICD-10-CM

## 2017-01-10 DIAGNOSIS — F5101 Primary insomnia: Secondary | ICD-10-CM | POA: Diagnosis not present

## 2017-01-10 MED ORDER — SUVOREXANT 10 MG PO TABS: 10.0000 mg | ORAL_TABLET | Freq: Every day | ORAL | 0 refills | Status: DC

## 2017-01-10 NOTE — Progress Notes (Signed)
Date:  01/10/2017   Name:  Christina Carlson   DOB:  August 24, 1975   MRN:  [id number]   Chief Complaint: Annual Exam (Last Seen Eye doctor in March. Hep B titer and Gold blood test. ) Chistina L Carlson is a 41 y.o. female who presents today for her school physical Exam for [employer]. She is entering the Surgical Tech program.  She feels well. She reports exercising some. She reports she is sleeping fairly well with ambien.  Color vision testing is normal. Distance vision is 20/15 and 20/30. She does think the [place] may affect her memory and would like to try Belsomra.  Review of Systems  Constitutional: Negative for chills, fatigue and fever.  HENT: Negative for hearing loss and trouble swallowing.   Eyes: Negative for visual disturbance.  Respiratory: Negative for chest tightness, shortness of breath and wheezing.   Cardiovascular: Negative for chest pain, palpitations and leg swelling.  Gastrointestinal: Negative for abdominal distention and abdominal pain.  Musculoskeletal: Negative for arthralgias.  Allergic/Immunologic: Negative for environmental allergies.  Neurological: Negative for dizziness and headaches.  Psychiatric/Behavioral: Positive for sleep disturbance. Negative for decreased concentration and dysphoric mood.    Patient Active Problem List   Diagnosis Date Noted  . 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 Atypical squamous cells of undetermined significance on cytologic smear of cervix (ASC-US) 06/27/2015  . Vitamin B12 nutritional deficiency 05/07/2015  . LBP (low back pain) 09/16/2014  . Breast screening 09/16/2014  . Bariatric surgery status 09/16/2014  . Gastro-esophageal reflux disease without esophagitis 09/16/2014  . H/O motion sickness 09/16/2014  . Migraine without aura and responsive to treatment 09/16/2014  . Idiopathic insomnia 09/16/2014    Prior to Admission medications   Medication Sig Start Date End Date Taking? Authorizing Provider  LORZONE 750 MG TABS TAKE 1  TABLET BY MOUTH FOUR TIMES DAILY AS NEEDED 09/05/16  Yes Reubin MilanBerglund, Avanthika Dehnert H, MD  NIKKI 3-0.02 MG tablet TAKE 1 TABLET BY MOUTH DAILY 07/04/16  Yes Reubin MilanBerglund, Jaevian Shean H, MD  zolpidem (AMBIEN) 10 MG tablet Take 1 tablet (10 mg total) by mouth at bedtime as needed. 01/05/17  Yes Reubin MilanBerglund, Jack Mineau H, MD    Allergies  Allergen Reactions  . Sulfa Antibiotics     Past Surgical History:  Procedure Laterality Date  . INGUINAL HERNIA REPAIR  1981  . ROUX-EN-Y GASTRIC BYPASS  2007  . TONSILLECTOMY      Social History  Substance Use Topics  . Smoking status: Never Smoker  . Smokeless tobacco: Never Used  . Alcohol use No     Medication list has been reviewed and updated.   Physical Exam  Constitutional: She is oriented to person, place, and time. She appears well-developed. No distress.  HENT:  Head: Normocephalic and atraumatic.  Neck: Normal range of motion. Neck supple.  Cardiovascular: Normal rate and normal heart sounds.   Pulmonary/Chest: Effort normal and breath sounds normal. No respiratory distress. She has no wheezes.  Abdominal: Soft. Bowel sounds are normal.  Musculoskeletal: Normal range of motion. She exhibits no edema or tenderness.  Neurological: She is alert and oriented to person, place, and time.  Skin: Skin is warm and dry. No rash noted.  Psychiatric: She has a normal mood and affect. Her behavior is normal. Thought content normal.  Nursing note and vitals reviewed.   BP 124/80   Pulse 93   Temp 98 F (36.7 C)   Resp 14   Ht 5\' 4"  (1.626 m)   Wt 221  lb (100.2 kg)   LMP 12/27/2016 (Approximate)   SpO2 99%   BMI 37.93 kg/m   Assessment and Plan: 1. School physical exam Form completed, labs ordered - Quantiferon tb gold assay (blood) - Hepatitis B surface antibody  2. Idiopathic insomnia Trial of belsomra - Suvorexant (BELSOMRA) 10 MG TABS; Take 10 mg by mouth at bedtime.  Dispense: 30 tablet; Refill: 0   Meds ordered this encounter  Medications  .  DISCONTD: Suvorexant (BELSOMRA) 10 MG TABS    Sig: Take 10 mg by mouth at bedtime.    Dispense:  10 tablet    Refill:  0  . Suvorexant (BELSOMRA) 10 MG TABS    Sig: Take 10 mg by mouth at bedtime.    Dispense:  30 tablet    Refill:  0    Christina Edward, MD [facility]  01/10/2017

## 2017-01-13 LAB — QUANTIFERON IN TUBE
QFT TB AG MINUS NIL VALUE: 0.22 IU/mL
QUANTIFERON MITOGEN VALUE: >10 IU/mL
QUANTIFERON NIL VALUE: 0.06 [IU]/mL
QUANTIFERON TB AG VALUE: 0.28 [IU]/mL
QUANTIFERON TB GOLD: NEGATIVE

## 2017-01-13 LAB — HEPATITIS B SURFACE ANTIBODY,QUALITATIVE: Hep B Surface Ab, Qual: NONREACTIVE

## 2017-01-13 LAB — QUANTIFERON TB GOLD ASSAY (BLOOD)

## 2017-01-14 ENCOUNTER — Ambulatory Visit (INDEPENDENT_AMBULATORY_CARE_PROVIDER_SITE_OTHER): Payer: BLUE CROSS/BLUE SHIELD

## 2017-01-14 DIAGNOSIS — Z23 Encounter for immunization: Secondary | ICD-10-CM

## 2017-01-14 MED ORDER — HEPATITIS B VAC RECOMBINANT 10 MCG/ML IJ SUSP
1.0000 mL | Freq: Once | INTRAMUSCULAR | Status: AC
  Administered 2017-01-14: 10 ug via INTRAMUSCULAR

## 2017-01-14 MED ORDER — HEPATITIS B VAC RECOMBINANT 10 MCG/0.5ML IJ SUSP: 0.5000 mL | Freq: Once | INTRAMUSCULAR | Status: DC

## 2017-02-24 ENCOUNTER — Other Ambulatory Visit: Payer: Self-pay | Admitting: Internal Medicine

## 2017-02-24 ENCOUNTER — Telehealth: Payer: Self-pay

## 2017-02-24 DIAGNOSIS — Z9229 Personal history of other drug therapy: Secondary | ICD-10-CM

## 2017-02-24 NOTE — Telephone Encounter (Signed)
Patient called stating the last thing she needs for school is to have a Hep B titer drawn. Wanted to request this order to be put in. Pt said when calling back can leave info on VM.  Please Advise.

## 2017-02-25 ENCOUNTER — Encounter: Payer: Self-pay | Admitting: Internal Medicine

## 2017-02-25 ENCOUNTER — Ambulatory Visit (INDEPENDENT_AMBULATORY_CARE_PROVIDER_SITE_OTHER): Payer: BLUE CROSS/BLUE SHIELD | Admitting: Internal Medicine

## 2017-02-25 VITALS — BP 128/72 | HR 75 | Temp 98.0°F | Ht 64.0 in | Wt 221.0 lb

## 2017-02-25 DIAGNOSIS — N3001 Acute cystitis with hematuria: Secondary | ICD-10-CM

## 2017-02-25 DIAGNOSIS — Z9229 Personal history of other drug therapy: Secondary | ICD-10-CM | POA: Diagnosis not present

## 2017-02-25 LAB — POC URINALYSIS WITH MICROSCOPIC (NON AUTO)MANUAL RESULT
Bilirubin, UA: NEGATIVE
Blood, UA: NEGATIVE
CRYSTALS: 0
EPITHELIAL CELLS, URINE PER MICROSCOPY: 3
Glucose, UA: NEGATIVE
Ketones, UA: NEGATIVE
MUCUS UA: 0
Nitrite, UA: NEGATIVE
RBC: 0 M/uL — AB (ref 4.04–5.48)
SPEC GRAV UA: 1.015 (ref 1.010–1.025)
Urobilinogen, UA: 0.2 E.U./dL
WBC Casts, UA: 30
pH, UA: 5 (ref 5.0–8.0)

## 2017-02-25 MED ORDER — CIPROFLOXACIN HCL 250 MG PO TABS: 250.0000 mg | ORAL_TABLET | Freq: Two times a day (BID) | ORAL | 0 refills | Status: AC

## 2017-02-25 NOTE — Progress Notes (Signed)
The 1   Date:  02/25/2017   Name:  Christina Carlson   DOB:  04-05-1976   MRN:  [id number]   Chief Complaint: Urinary Tract Infection (Feeling of urgency. Started last night- went to the bathroom a lot last night. Having feeling of not finishing when is done using the bathroom. )  Urinary Tract Infection   This is a new problem. The current episode started yesterday. The problem occurs every urination. The problem has been gradually worsening. The quality of the pain is described as burning. Associated symptoms include a discharge, frequency, hesitancy and urgency. Pertinent negatives include no chills, hematuria, nausea or vomiting. She has tried increased fluids for the symptoms.      Review of Systems  Constitutional: Negative for chills, fatigue and fever.  Respiratory: Negative for chest tightness and shortness of breath.   Cardiovascular: Negative for chest pain.  Gastrointestinal: Negative for diarrhea, nausea and vomiting.  Genitourinary: Positive for frequency, hesitancy and urgency. Negative for hematuria.  Neurological: Negative for dizziness and headaches.    Patient Active Problem List   Diagnosis Date Noted  . 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 Atypical squamous cells of undetermined significance on cytologic smear of cervix (ASC-US) 06/27/2015  . Vitamin B12 nutritional deficiency 05/07/2015  . LBP (low back pain) 09/16/2014  . Breast screening 09/16/2014  . Bariatric surgery status 09/16/2014  . Gastro-esophageal reflux disease without esophagitis 09/16/2014  . H/O motion sickness 09/16/2014  . Migraine without aura and responsive to treatment 09/16/2014  . Idiopathic insomnia 09/16/2014    Prior to Admission medications   Medication Sig Start Date End Date Taking? Authorizing Provider  LORZONE 750 MG TABS TAKE 1 TABLET BY MOUTH FOUR TIMES DAILY AS NEEDED 09/05/16  Yes [name], MD  NIKKI 3-0.02 MG tablet TAKE 1 TABLET BY MOUTH DAILY 07/04/16  Yes [name], MD  zolpidem (AMBIEN) 10 MG tablet Take 1 tablet (10 mg total) by mouth at bedtime as needed. 01/05/17  Yes [name], MD    Allergies  Allergen Reactions  . Sulfa Antibiotics     Past Surgical History:  Procedure Laterality Date  . INGUINAL HERNIA REPAIR  1981  . ROUX-EN-Y GASTRIC BYPASS  2007  . TONSILLECTOMY      Social History  Substance Use Topics  . Smoking status: Never Smoker  . Smokeless tobacco: Never Used  . Alcohol use No     Medication list has been reviewed and updated.  PHQ 2/9 Scores 01/10/2017 11/26/2015  PHQ - 2 Score 0 0    Physical Exam  Constitutional: She appears well-developed and well-nourished.  Cardiovascular: Normal rate, regular rhythm and normal heart sounds.   Pulmonary/Chest: Effort normal and breath sounds normal. No respiratory distress.  Abdominal: Soft. Bowel sounds are normal. There is tenderness in the suprapubic area. There is no rebound, no guarding and no CVA tenderness.  Psychiatric: She has a normal mood and affect.  Nursing note and vitals reviewed.   BP 128/72   Pulse 75   Temp 98 F (36.7 C) (Oral)   Ht  (1.626 m)   Wt 221 lb (100.2 kg)   SpO2 100%   BMI 37.93 kg/m   Assessment and Plan: 1. Acute cystitis with hematuria Continue fluids - POC urinalysis w microscopic (non auto) - ciprofloxacin (CIPRO) 250 MG tablet; Take 1 tablet (250 mg total) by mouth 2 (two) times daily.  Dispense: 14 tablet; Refill: 0   Meds ordered this encounter  Medications  . ciprofloxacin (CIPRO) 250 MG tablet    Sig: Take 1 tablet (250 mg total) by mouth 2 (two) times daily.    Dispense:  14 tablet    Refill:  0    Partially dictated using Animal nutritionist. Any errors are unintentional.  Bari Edward, MD [facility]  02/25/2017

## 2017-02-26 LAB — HEPATITIS B SURFACE ANTIBODY,QUALITATIVE: HEP B SURFACE AB, QUAL: NONREACTIVE

## 2017-03-01 ENCOUNTER — Ambulatory Visit: Payer: BLUE CROSS/BLUE SHIELD

## 2017-03-01 ENCOUNTER — Other Ambulatory Visit: Payer: Self-pay

## 2017-03-01 DIAGNOSIS — E538 Deficiency of other specified B group vitamins: Secondary | ICD-10-CM | POA: Diagnosis not present

## 2017-03-01 DIAGNOSIS — Z23 Encounter for immunization: Secondary | ICD-10-CM

## 2017-03-01 MED ORDER — HEPATITIS B VAC RECOMBINANT 10 MCG/ML IJ SUSP
1.0000 mL | Freq: Once | INTRAMUSCULAR | Status: AC
  Administered 2017-03-01: 10 ug via INTRAMUSCULAR

## 2017-03-02 LAB — VITAMIN B12

## 2017-03-25 ENCOUNTER — Telehealth: Payer: Self-pay

## 2017-03-25 NOTE — Telephone Encounter (Signed)
Sudafed and Flonase

## 2017-03-25 NOTE — Telephone Encounter (Signed)
Patient called stating she woke up this morning with fluid in her L ear. States its becoming painful. Wants to know if there is anything OTC she can try before an OV. Please Advise?

## 2017-03-25 NOTE — Telephone Encounter (Signed)
Patient informed. 

## 2017-04-01 ENCOUNTER — Telehealth: Payer: Self-pay

## 2017-04-01 NOTE — Telephone Encounter (Signed)
Patient called stating she has to go out of town for a sudden trip. Will be out of town for about 9 days. Needs Ambien filled early so that she does not run out of medication for Ambien 10mg  for sleep. Spoke to Dr Judithann GravesBerglund and she approved EARLY REFILL FOR AMBIEN. Called pharmacy and patient will pick medication up today 04/01/2017.

## 2017-04-08 ENCOUNTER — Ambulatory Visit (INDEPENDENT_AMBULATORY_CARE_PROVIDER_SITE_OTHER): Payer: BLUE CROSS/BLUE SHIELD

## 2017-04-08 DIAGNOSIS — E538 Deficiency of other specified B group vitamins: Secondary | ICD-10-CM | POA: Diagnosis not present

## 2017-04-29 ENCOUNTER — Ambulatory Visit (INDEPENDENT_AMBULATORY_CARE_PROVIDER_SITE_OTHER): Payer: BLUE CROSS/BLUE SHIELD

## 2017-04-29 DIAGNOSIS — E538 Deficiency of other specified B group vitamins: Secondary | ICD-10-CM

## 2017-04-29 MED ORDER — CYANOCOBALAMIN 1000 MCG/ML IJ SOLN
1000.0000 ug | Freq: Once | INTRAMUSCULAR | Status: AC
  Administered 2017-04-29: 1000 ug via INTRAMUSCULAR

## 2017-05-05 ENCOUNTER — Telehealth: Payer: Self-pay

## 2017-05-05 NOTE — Telephone Encounter (Signed)
Patient called stating insurance is no longer covering lorzone medication for muscle spasms. Pharmacy told her to call PCP to see if we can send in cyclobenzaprine so insurance will cover it instead.  Please Advise.

## 2017-05-05 NOTE — Telephone Encounter (Signed)
ERROR

## 2017-05-06 NOTE — Telephone Encounter (Signed)
I have not discussed muscle spasms with her since 12/2015.  To change therapy, she will need an office visit.

## 2017-05-10 NOTE — Telephone Encounter (Signed)
Patient informed of needing OV to discuss change in therapy.

## 2017-05-30 ENCOUNTER — Ambulatory Visit (INDEPENDENT_AMBULATORY_CARE_PROVIDER_SITE_OTHER): Payer: BLUE CROSS/BLUE SHIELD

## 2017-05-30 ENCOUNTER — Telehealth: Payer: Self-pay

## 2017-05-30 ENCOUNTER — Other Ambulatory Visit: Payer: Self-pay | Admitting: Internal Medicine

## 2017-05-30 DIAGNOSIS — E538 Deficiency of other specified B group vitamins: Secondary | ICD-10-CM | POA: Diagnosis not present

## 2017-05-30 DIAGNOSIS — Z30011 Encounter for initial prescription of contraceptive pills: Secondary | ICD-10-CM

## 2017-05-30 MED ORDER — ZOLPIDEM TARTRATE 10 MG PO TABS: 10.0000 mg | ORAL_TABLET | Freq: Every evening | ORAL | 5 refills | Status: DC | PRN

## 2017-05-30 MED ORDER — DROSPIRENONE-ETHINYL ESTRADIOL 3-0.02 MG PO TABS: 1.0000 | ORAL_TABLET | Freq: Every day | ORAL | 12 refills | Status: DC

## 2017-05-30 NOTE — Progress Notes (Signed)
Patient came in today and received b12 shot. Tolerated meds well.

## 2017-05-30 NOTE — Telephone Encounter (Signed)
Patient came into office and received monthly B12 injection. Stated she is on her last refills for [place] and birth control and would like refills sent in.   Please Advise.

## 2017-05-30 NOTE — Telephone Encounter (Signed)
Refills done.

## 2017-06-07 DIAGNOSIS — H5213 Myopia, bilateral: Secondary | ICD-10-CM | POA: Diagnosis not present

## 2017-06-28 ENCOUNTER — Other Ambulatory Visit: Payer: Self-pay | Admitting: Internal Medicine

## 2017-06-29 NOTE — Telephone Encounter (Signed)
lvm to set up appt

## 2017-06-30 ENCOUNTER — Ambulatory Visit: Payer: BLUE CROSS/BLUE SHIELD

## 2017-06-30 ENCOUNTER — Ambulatory Visit (INDEPENDENT_AMBULATORY_CARE_PROVIDER_SITE_OTHER): Payer: BLUE CROSS/BLUE SHIELD

## 2017-06-30 DIAGNOSIS — E538 Deficiency of other specified B group vitamins: Secondary | ICD-10-CM

## 2017-07-01 ENCOUNTER — Ambulatory Visit: Payer: BLUE CROSS/BLUE SHIELD

## 2017-07-07 ENCOUNTER — Telehealth: Payer: Self-pay

## 2017-07-07 DIAGNOSIS — D510 Vitamin B12 deficiency anemia due to intrinsic factor deficiency: Secondary | ICD-10-CM

## 2017-07-07 NOTE — Telephone Encounter (Signed)
Patient called stating she is feeling out her current life insurance company papers and they are requesting a current CBC lab. Wanted to know if we could order this for her.  Please Advise?

## 2017-07-07 NOTE — Addendum Note (Signed)
Addended by: Vonda AntiguaOX, CHASSIDY N on: 07/07/2017 12:26 PM   Modules accepted: Orders

## 2017-07-07 NOTE — Telephone Encounter (Signed)
Can just order a CBC.

## 2017-07-13 DIAGNOSIS — D510 Vitamin B12 deficiency anemia due to intrinsic factor deficiency: Secondary | ICD-10-CM | POA: Diagnosis not present

## 2017-07-14 LAB — CBC WITH DIFFERENTIAL/PLATELET
BASOS: 0 %
Basophils Absolute: 0 10*3/uL (ref 0.0–0.2)
EOS (ABSOLUTE): 0.2 10*3/uL (ref 0.0–0.4)
EOS: 3 %
HEMATOCRIT: 32.8 % — AB (ref 34.0–46.6)
Hemoglobin: 10.7 g/dL — ABNORMAL LOW (ref 11.1–15.9)
Immature Grans (Abs): 0 10*3/uL (ref 0.0–0.1)
Immature Granulocytes: 0 %
LYMPHS ABS: 2.9 10*3/uL (ref 0.7–3.1)
Lymphs: 37 %
MCH: 26.3 pg — AB (ref 26.6–33.0)
MCHC: 32.6 g/dL (ref 31.5–35.7)
MCV: 81 fL (ref 79–97)
MONOS ABS: 0.4 10*3/uL (ref 0.1–0.9)
Monocytes: 5 %
NEUTROS ABS: 4.4 10*3/uL (ref 1.4–7.0)
Neutrophils: 55 %
Platelets: 405 10*3/uL — ABNORMAL HIGH (ref 150–379)
RBC: 4.07 x10E6/uL (ref 3.77–5.28)
RDW: 15.7 % — ABNORMAL HIGH (ref 12.3–15.4)
WBC: 7.9 10*3/uL (ref 3.4–10.8)

## 2017-07-29 ENCOUNTER — Ambulatory Visit (INDEPENDENT_AMBULATORY_CARE_PROVIDER_SITE_OTHER): Payer: BLUE CROSS/BLUE SHIELD

## 2017-07-29 DIAGNOSIS — E538 Deficiency of other specified B group vitamins: Secondary | ICD-10-CM

## 2017-07-29 MED ORDER — CYANOCOBALAMIN 1000 MCG/ML IJ SOLN
1000.0000 ug | Freq: Once | INTRAMUSCULAR | Status: AC
  Administered 2017-07-29: 1000 ug via INTRAMUSCULAR

## 2017-08-02 ENCOUNTER — Ambulatory Visit: Payer: BLUE CROSS/BLUE SHIELD

## 2017-08-12 ENCOUNTER — Encounter: Payer: Self-pay | Admitting: Family Medicine

## 2017-08-12 ENCOUNTER — Ambulatory Visit: Payer: BLUE CROSS/BLUE SHIELD | Admitting: Family Medicine

## 2017-08-12 ENCOUNTER — Other Ambulatory Visit: Payer: Self-pay | Admitting: Internal Medicine

## 2017-08-12 VITALS — BP 123/82 | HR 98 | Temp 98.0°F | Resp 16 | Ht 64.0 in | Wt 217.0 lb

## 2017-08-12 DIAGNOSIS — R350 Frequency of micturition: Secondary | ICD-10-CM

## 2017-08-12 DIAGNOSIS — N3000 Acute cystitis without hematuria: Secondary | ICD-10-CM | POA: Diagnosis not present

## 2017-08-12 DIAGNOSIS — Z30011 Encounter for initial prescription of contraceptive pills: Secondary | ICD-10-CM

## 2017-08-12 LAB — POCT URINALYSIS DIPSTICK
BILIRUBIN UA: NEGATIVE
GLUCOSE UA: NEGATIVE
Ketones, UA: NEGATIVE
Nitrite, UA: POSITIVE
PROTEIN UA: NEGATIVE
SPEC GRAV UA: 1.01 (ref 1.010–1.025)
Urobilinogen, UA: 0.2 E.U./dL
pH, UA: 7 (ref 5.0–8.0)

## 2017-08-12 MED ORDER — CEPHALEXIN 500 MG PO CAPS: 500.0000 mg | ORAL_CAPSULE | Freq: Two times a day (BID) | ORAL | 0 refills | Status: DC

## 2017-08-12 NOTE — Progress Notes (Signed)
Name: Christina Carlson   MRN: [id number]    DOB: 20-Dec-1975   Date:08/12/2017       Progress Note  Subjective  Chief Complaint  Chief Complaint  Patient presents with  . Urinary Frequency    started yesterday feeling pain on left side and feeling like she has to urinate after urniating.     Urinary Frequency   This is a new problem. The current episode started yesterday. The problem occurs intermittently. The problem has been gradually worsening. The patient is experiencing no pain. There has been no fever. Associated symptoms include chills, flank pain and frequency. Pertinent negatives include no discharge, hematuria, hesitancy, nausea, possible pregnancy, sweats, urgency or vomiting. She has tried nothing for the symptoms. There is no history of kidney stones, recurrent UTIs or a urological procedure.    No problem-specific Assessment & Plan notes found for this encounter.   Past Medical History:  Diagnosis Date  . Migraine   . Obese     Past Surgical History:  Procedure Laterality Date  . INGUINAL HERNIA REPAIR  1981  . ROUX-EN-Y GASTRIC BYPASS  2007  . TONSILLECTOMY      Family History  Family history unknown: Yes    Social History   Socioeconomic History  . Marital status: Married    Spouse name: Not on file  . Number of children: Not on file  . Years of education: Not on file  . Highest education level: Not on file  Social Needs  . Financial resource strain: Patient refused  . Food insecurity - worry: Patient refused  . Food insecurity - inability: Patient refused  . Transportation needs - medical: Patient refused  . Transportation needs - non-medical: Patient refused  Occupational History  . Not on file  Tobacco Use  . Smoking status: Never Smoker  . Smokeless tobacco: Never Used  Substance and Sexual Activity  . Alcohol use: No    Alcohol/week: 0.0 oz  . Drug use: No  . Sexual activity: Not on file  Other Topics Concern  . Not on file  Social  History Narrative  . Not on file    Allergies  Allergen Reactions  . Sulfa Antibiotics     Outpatient Medications Prior to Visit  Medication Sig Dispense Refill  . drospirenone-ethinyl estradiol (NIKKI) 3-0.02 MG tablet Take 1 tablet by mouth daily. 28 tablet 12  . zolpidem (AMBIEN) 10 MG tablet TAKE 1 TABLET BY MOUTH AT BEDTIME AS NEEDED 30 tablet 5  . LORZONE 750 MG TABS TAKE 1 TABLET BY MOUTH FOUR TIMES DAILY AS NEEDED 60 tablet 5   Facility-Administered Medications Prior to Visit  Medication Dose Route Frequency Provider Last Rate Last Dose  . cyanocobalamin ((VITAMIN B-12)) injection 1,000 mcg  1,000 mcg Intramuscular Q30 days [name], MD   1,000 mcg at 06/30/17 1111    Review of Systems  Constitutional: Positive for chills. Negative for fever, malaise/fatigue and weight loss.  HENT: Negative for ear discharge, ear pain and sore throat.   Eyes: Negative for blurred vision.  Respiratory: Negative for cough, sputum production, shortness of breath and wheezing.   Cardiovascular: Negative for chest pain, palpitations and leg swelling.  Gastrointestinal: Negative for abdominal pain, blood in stool, constipation, diarrhea, heartburn, melena, nausea and vomiting.  Genitourinary: Positive for flank pain and frequency. Negative for dysuria, hematuria, hesitancy and urgency.  Musculoskeletal: Negative for back pain, joint pain, myalgias and neck pain.  Skin: Negative for rash.  Neurological: Negative for dizziness,  tingling, sensory change, focal weakness and headaches.  Endo/Heme/Allergies: Negative for environmental allergies and polydipsia. Does not bruise/bleed easily.  Psychiatric/Behavioral: Negative for depression and suicidal ideas. The patient is not nervous/anxious and does not have insomnia.      Objective  Vitals:   08/12/17 0942  BP: 123/82  Pulse: 98  Resp: 16  Temp: 98 F (36.7 C)  Weight: 217 lb (98.4 kg)  Height: 5\' 4"  (1.626 m)    Physical Exam   Constitutional: She is well-developed, well-nourished, and in no distress. No distress.  HENT:  Head: Normocephalic and atraumatic.  Right Ear: External ear normal.  Left Ear: External ear normal.  Nose: Nose normal.  Mouth/Throat: Oropharynx is clear and moist.  Eyes: Conjunctivae and EOM are normal. Pupils are equal, round, and reactive to light. Right eye exhibits no discharge. Left eye exhibits no discharge.  Neck: Normal range of motion. Neck supple. No JVD present. No thyromegaly present.  Cardiovascular: Normal rate, regular rhythm, normal heart sounds and intact distal pulses. Exam reveals no gallop and no friction rub.  No murmur heard. Pulmonary/Chest: Effort normal and breath sounds normal. She has no wheezes. She has no rales.  Abdominal: Soft. Bowel sounds are normal. She exhibits no mass. There is no hepatosplenomegaly. There is no tenderness. There is no guarding and no CVA tenderness.  Musculoskeletal: Normal range of motion. She exhibits no edema.  Lymphadenopathy:    She has no cervical adenopathy.  Neurological: She is alert.  Skin: Skin is warm and dry. She is not diaphoretic.  Psychiatric: Mood and affect normal.  Nursing note and vitals reviewed.     Assessment & Plan  Problem List Items Addressed This Visit    None    Visit Diagnoses    Frequency of urination    -  Primary   Relevant Medications   cephALEXin (KEFLEX) 500 MG capsule   Other Relevant Orders   POCT urinalysis dipstick (Completed)   Acute cystitis without hematuria       Relevant Medications   cephALEXin (KEFLEX) 500 MG capsule      Meds ordered this encounter  Medications  . cephALEXin (KEFLEX) 500 MG capsule    Sig: Take 1 capsule (500 mg total) by mouth 2 (two) times daily.    Dispense:  6 capsule    Refill:  0      Dr. Elizabeth Sauereanna Cambre [facility]  08/12/17

## 2017-09-07 ENCOUNTER — Encounter: Payer: BLUE CROSS/BLUE SHIELD | Admitting: Internal Medicine

## 2017-09-14 ENCOUNTER — Encounter: Payer: Self-pay | Admitting: Internal Medicine

## 2017-09-14 ENCOUNTER — Ambulatory Visit (INDEPENDENT_AMBULATORY_CARE_PROVIDER_SITE_OTHER): Payer: BLUE CROSS/BLUE SHIELD | Admitting: Internal Medicine

## 2017-09-14 VITALS — BP 128/78 | HR 88 | Ht 64.0 in | Wt 215.0 lb

## 2017-09-14 DIAGNOSIS — Z0000 Encounter for general adult medical examination without abnormal findings: Secondary | ICD-10-CM | POA: Diagnosis not present

## 2017-09-14 DIAGNOSIS — N921 Excessive and frequent menstruation with irregular cycle: Secondary | ICD-10-CM

## 2017-09-14 DIAGNOSIS — Z1239 Encounter for other screening for malignant neoplasm of breast: Secondary | ICD-10-CM

## 2017-09-14 DIAGNOSIS — Z1231 Encounter for screening mammogram for malignant neoplasm of breast: Secondary | ICD-10-CM

## 2017-09-14 DIAGNOSIS — E538 Deficiency of other specified B group vitamins: Secondary | ICD-10-CM

## 2017-09-14 DIAGNOSIS — K219 Gastro-esophageal reflux disease without esophagitis: Secondary | ICD-10-CM

## 2017-09-14 DIAGNOSIS — F5101 Primary insomnia: Secondary | ICD-10-CM | POA: Diagnosis not present

## 2017-09-14 DIAGNOSIS — G43009 Migraine without aura, not intractable, without status migrainosus: Secondary | ICD-10-CM

## 2017-09-14 LAB — POCT URINALYSIS DIPSTICK
BILIRUBIN UA: NEGATIVE
Glucose, UA: NEGATIVE
Ketones, UA: NEGATIVE
LEUKOCYTES UA: NEGATIVE
Nitrite, UA: NEGATIVE
Protein, UA: NEGATIVE
Spec Grav, UA: 1.02 (ref 1.010–1.025)
Urobilinogen, UA: 0.2 E.U./dL
pH, UA: 6 (ref 5.0–8.0)

## 2017-09-14 MED ORDER — CYANOCOBALAMIN 1000 MCG/ML IJ SOLN
1000.0000 ug | Freq: Once | INTRAMUSCULAR | Status: AC
  Administered 2017-09-14: 1000 ug via INTRAMUSCULAR

## 2017-09-14 NOTE — Progress Notes (Signed)
Date:  09/14/2017   Name:  Christina Carlson   DOB:  10-Apr-1976   MRN:  [id number]   Chief Complaint: Annual Exam (Breast Exam. Last pap was 2017 Neg HPV.)  Christina Carlson is a 42 y.o. female who presents today for her Complete Annual Exam. She feels well. She reports exercising waking dogs. She reports she is sleeping well on Ambien.  Last Pap in 2017 was normal.  She is due for a mammogram.  Birth control - doing fairly well but having some inter-menstrual spotting.  No pain, swelling, increase in headaches.  Insomnia - chronic daily symptoms for which she takes Ambien.  No apparent side effects or abnormal sleep behaviors.  B12 def - trying eat a balanced diet, feels well.  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  Weight is unchanged.  GERD - has been controlled until 2 days ago when she over ate pizza.  She felt like something was stuck in her throat and she has had some mild substernal discomfort.  Omeprazole has helped.  No vomiting or choking noted.  Review of Systems  Constitutional: Negative for chills, fatigue and fever.  HENT: Negative for congestion, hearing loss, tinnitus, trouble swallowing and voice change.   Eyes: Negative for visual disturbance.  Respiratory: Negative for cough, chest tightness, shortness of breath and wheezing.   Cardiovascular: Negative for chest pain, palpitations and leg swelling.  Gastrointestinal: Negative for abdominal pain, constipation, diarrhea and vomiting.  Endocrine: Negative for polydipsia and polyuria.  Genitourinary: Negative for dysuria, frequency, genital sores, vaginal bleeding and vaginal discharge.  Musculoskeletal: Negative for arthralgias, gait problem and joint swelling.  Skin: Negative for color change and rash.  Neurological: Negative for dizziness, tremors, light-headedness and headaches.  Hematological: Negative for adenopathy. Does not bruise/bleed easily.  Psychiatric/Behavioral: Negative for dysphoric mood and sleep  disturbance. The patient is not nervous/anxious.     Patient Active Problem List   Diagnosis Date Noted  . 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 Atypical squamous cells of undetermined significance on cytologic smear of cervix (ASC-US) 06/27/2015  . Vitamin B12 nutritional deficiency 05/07/2015  . LBP (low back pain) 09/16/2014  . Breast screening 09/16/2014  . Bariatric surgery status 09/16/2014  . Gastro-esophageal reflux disease without esophagitis 09/16/2014  . H/O motion sickness 09/16/2014  . Migraine without aura and responsive to treatment 09/16/2014  . Idiopathic insomnia 09/16/2014    Prior to Admission medications   Medication Sig Start Date End Date Taking? Authorizing Provider  drospirenone-ethinyl estradiol (NIKKI) 3-0.02 MG tablet Take 1 tablet by mouth daily. 05/30/17  Yes [name], MD  zolpidem (AMBIEN) 10 MG tablet TAKE 1 TABLET BY MOUTH AT BEDTIME AS NEEDED 06/29/17  Yes [name], MD    Allergies  Allergen Reactions  . Sulfa Antibiotics     Past Surgical History:  Procedure Laterality Date  . INGUINAL HERNIA REPAIR  1981  . ROUX-EN-Y GASTRIC BYPASS  2007  . TONSILLECTOMY      Social History   Tobacco Use  . Smoking status: Never Smoker  . Smokeless tobacco: Never Used  Substance Use Topics  . Alcohol use: No    Alcohol/week: 0.0 oz  . Drug use: No     Medication list has been reviewed and updated.  PHQ 2/9 Scores 01/10/2017 11/26/2015  PHQ - 2 Score 0 0    Physical Exam  Constitutional: She is oriented to person, place, and time. She appears well-developed and well-nourished. No distress.  HENT:  Head: Normocephalic and atraumatic.  Right Ear: Tympanic membrane and ear canal normal.  Left Ear: Tympanic membrane and ear canal normal.  Nose: Right sinus exhibits no maxillary sinus tenderness. Left sinus exhibits no maxillary sinus tenderness.  Mouth/Throat: Uvula is midline and oropharynx is clear and moist.  Eyes: Conjunctivae and  EOM are normal. Right eye exhibits no discharge. Left eye exhibits no discharge. No scleral icterus.  Neck: Normal range of motion. Carotid bruit is not present. No erythema present. No thyromegaly present.  Cardiovascular: Normal rate, regular rhythm, normal heart sounds and normal pulses.  Pulmonary/Chest: Effort normal. No respiratory distress. She has no wheezes. Right breast exhibits no mass, no nipple discharge, no skin change and no tenderness. Left breast exhibits no mass, no nipple discharge, no skin change and no tenderness.  Abdominal: Soft. Bowel sounds are normal. There is no hepatosplenomegaly. There is no tenderness. There is no CVA tenderness.  Musculoskeletal: Normal range of motion.  Lymphadenopathy:    She has no cervical adenopathy.    She has no axillary adenopathy.  Neurological: She is alert and oriented to person, place, and time. She has normal reflexes. No cranial nerve deficit or sensory deficit.  Skin: Skin is warm, dry and intact. No rash noted.  Psychiatric: She has a normal mood and affect. Her speech is normal and behavior is normal. Thought content normal.  Nursing note and vitals reviewed.   BP 128/78   Pulse 88   Ht 5\' 4"  (1.626 m)   Wt 215 lb (97.5 kg)   LMP  (Exact Date)   SpO2 100%   BMI 36.90 kg/m   Assessment and Plan: 1. Annual physical exam Encourage continued regular exercise - Comprehensive metabolic panel - Hemoglobin A1c - Lipid panel - POCT urinalysis dipstick  2. [facility] - MM DIGITAL SCREENING BILATERAL; Future  3. Migraine without aura and responsive to treatment unchanged  4. Gastro-esophageal reflux disease without esophagitis Use PPI intermittently; may also benefit from liquid antacids PRN - CBC with Differential/Platelet  5. Vitamin B12 nutritional deficiency Will give B12 injection today  6. Breakthrough bleeding on birth control pills Continue current regimen - if persistent will  consider changing OCP  7. Idiopathic insomnia Doing well on Ambien    No orders of the defined types were placed in this encounter.   Partially dictated using Animal nutritionistDragon software. Any errors are unintentional.  Bari EdwardLaura Kamori Kitchens, MD [facility]  09/14/2017

## 2017-09-15 LAB — CBC WITH DIFFERENTIAL/PLATELET
BASOS: 0 %
Basophils Absolute: 0 10*3/uL (ref 0.0–0.2)
EOS (ABSOLUTE): 0.1 10*3/uL (ref 0.0–0.4)
EOS: 1 %
HEMATOCRIT: 34.9 % (ref 34.0–46.6)
Hemoglobin: 10.8 g/dL — ABNORMAL LOW (ref 11.1–15.9)
Immature Grans (Abs): 0 10*3/uL (ref 0.0–0.1)
Immature Granulocytes: 0 %
LYMPHS ABS: 2.3 10*3/uL (ref 0.7–3.1)
Lymphs: 34 %
MCH: 25.6 pg — ABNORMAL LOW (ref 26.6–33.0)
MCHC: 30.9 g/dL — AB (ref 31.5–35.7)
MCV: 83 fL (ref 79–97)
MONOS ABS: 0.3 10*3/uL (ref 0.1–0.9)
Monocytes: 5 %
NEUTROS ABS: 4 10*3/uL (ref 1.4–7.0)
Neutrophils: 60 %
Platelets: 467 10*3/uL — ABNORMAL HIGH (ref 150–379)
RBC: 4.22 x10E6/uL (ref 3.77–5.28)
RDW: 15 % (ref 12.3–15.4)
WBC: 6.7 10*3/uL (ref 3.4–10.8)

## 2017-09-15 LAB — COMPREHENSIVE METABOLIC PANEL
A/G RATIO: 1.4 (ref 1.2–2.2)
ALT: 13 IU/L (ref 0–32)
AST: 14 IU/L (ref 0–40)
Albumin: 3.7 g/dL (ref 3.5–5.5)
Alkaline Phosphatase: 83 IU/L (ref 39–117)
BUN / CREAT RATIO: 10 (ref 9–23)
BUN: 8 mg/dL (ref 6–24)
Bilirubin Total: <0.2 mg/dL (ref 0.0–1.2)
CO2: 19 mmol/L — ABNORMAL LOW (ref 20–29)
Calcium: 8.8 mg/dL (ref 8.7–10.2)
Chloride: 103 mmol/L (ref 96–106)
Creatinine, Ser: 0.77 mg/dL (ref 0.57–1.00)
GFR calc Af Amer: 109 mL/min/{1.73_m2} (ref >59)
GFR, EST NON AFRICAN AMERICAN: 95 mL/min/{1.73_m2} (ref >59)
GLOBULIN, TOTAL: 2.7 g/dL (ref 1.5–4.5)
Glucose: 85 mg/dL (ref 65–99)
POTASSIUM: 3.9 mmol/L (ref 3.5–5.2)
SODIUM: 137 mmol/L (ref 134–144)
Total Protein: 6.4 g/dL (ref 6.0–8.5)

## 2017-09-15 LAB — HEMOGLOBIN A1C
ESTIMATED AVERAGE GLUCOSE: 105 mg/dL
Hgb A1c MFr Bld: 5.3 % (ref 4.8–5.6)

## 2017-09-15 LAB — LIPID PANEL
CHOL/HDL RATIO: 3.3 ratio (ref 0.0–4.4)
Cholesterol, Total: 197 mg/dL (ref 100–199)
HDL: 59 mg/dL (ref >39)
LDL Calculated: 103 mg/dL — ABNORMAL HIGH (ref 0–99)
Triglycerides: 174 mg/dL — ABNORMAL HIGH (ref 0–149)
VLDL CHOLESTEROL CAL: 35 mg/dL (ref 5–40)

## 2017-09-21 ENCOUNTER — Ambulatory Visit
Admission: RE | Admit: 2017-09-21 | Discharge: 2017-09-21 | Disposition: A | Discharge status: Home / Self Care | Payer: BLUE CROSS/BLUE SHIELD | Source: Ambulatory Visit | Attending: Internal Medicine | Admitting: Internal Medicine

## 2017-09-21 DIAGNOSIS — Z1239 Encounter for other screening for malignant neoplasm of breast: Secondary | ICD-10-CM

## 2017-09-21 DIAGNOSIS — Z1231 Encounter for screening mammogram for malignant neoplasm of breast: Secondary | ICD-10-CM | POA: Diagnosis not present

## 2017-09-26 DIAGNOSIS — M7989 Other specified soft tissue disorders: Secondary | ICD-10-CM | POA: Diagnosis not present

## 2017-09-26 DIAGNOSIS — M79662 Pain in left lower leg: Secondary | ICD-10-CM | POA: Diagnosis not present

## 2017-09-26 DIAGNOSIS — S93492A Sprain of other ligament of left ankle, initial encounter: Secondary | ICD-10-CM | POA: Diagnosis not present

## 2017-09-26 DIAGNOSIS — Z9884 Bariatric surgery status: Secondary | ICD-10-CM | POA: Diagnosis not present

## 2017-09-26 DIAGNOSIS — S99912A Unspecified injury of left ankle, initial encounter: Secondary | ICD-10-CM | POA: Diagnosis not present

## 2017-09-26 DIAGNOSIS — S93402A Sprain of unspecified ligament of left ankle, initial encounter: Secondary | ICD-10-CM | POA: Diagnosis not present

## 2017-09-26 DIAGNOSIS — L559 Sunburn, unspecified: Secondary | ICD-10-CM | POA: Diagnosis not present

## 2017-09-26 DIAGNOSIS — Z882 Allergy status to sulfonamides status: Secondary | ICD-10-CM | POA: Diagnosis not present

## 2017-10-14 ENCOUNTER — Ambulatory Visit (INDEPENDENT_AMBULATORY_CARE_PROVIDER_SITE_OTHER): Payer: BLUE CROSS/BLUE SHIELD

## 2017-10-14 DIAGNOSIS — D518 Other vitamin B12 deficiency anemias: Secondary | ICD-10-CM

## 2017-10-14 MED ORDER — CYANOCOBALAMIN 1000 MCG/ML IJ SOLN
1000.0000 ug | Freq: Once | INTRAMUSCULAR | Status: AC
  Administered 2017-10-14: 1000 ug via INTRAMUSCULAR

## 2017-10-14 NOTE — Progress Notes (Signed)
B12 was given 

## 2017-10-17 ENCOUNTER — Ambulatory Visit: Payer: BLUE CROSS/BLUE SHIELD

## 2017-11-14 ENCOUNTER — Ambulatory Visit (INDEPENDENT_AMBULATORY_CARE_PROVIDER_SITE_OTHER): Payer: BLUE CROSS/BLUE SHIELD

## 2017-11-14 ENCOUNTER — Telehealth: Payer: Self-pay

## 2017-11-14 ENCOUNTER — Other Ambulatory Visit: Payer: Self-pay | Admitting: Internal Medicine

## 2017-11-14 DIAGNOSIS — D519 Vitamin B12 deficiency anemia, unspecified: Secondary | ICD-10-CM

## 2017-11-14 MED ORDER — CYANOCOBALAMIN 1000 MCG/ML IJ SOLN
1000.0000 ug | Freq: Once | INTRAMUSCULAR | Status: AC
  Administered 2017-11-14: 1000 ug via INTRAMUSCULAR

## 2017-11-14 MED ORDER — DROSPIRENONE-ETHINYL ESTRADIOL 3-0.03 MG PO TABS: 1.0000 | ORAL_TABLET | Freq: Every day | ORAL | 11 refills | Status: DC

## 2017-11-14 NOTE — Telephone Encounter (Signed)
Patient notified

## 2017-11-14 NOTE — Telephone Encounter (Signed)
Pt came in for B123 and complained of spotting on BC med. She said that you had talked to her about increasing med to next dose if she continued to spot. The spotting is "not heavy" but is still there. Otherwise, everything is going well.

## 2017-11-14 NOTE — Telephone Encounter (Signed)
OCP changed to Yasmin with slightly higher dose of estrogen.

## 2017-12-08 ENCOUNTER — Other Ambulatory Visit: Payer: Self-pay | Admitting: Internal Medicine

## 2017-12-14 ENCOUNTER — Ambulatory Visit (INDEPENDENT_AMBULATORY_CARE_PROVIDER_SITE_OTHER): Payer: BLUE CROSS/BLUE SHIELD

## 2017-12-14 DIAGNOSIS — E538 Deficiency of other specified B group vitamins: Secondary | ICD-10-CM

## 2017-12-21 ENCOUNTER — Other Ambulatory Visit: Payer: Self-pay

## 2017-12-21 DIAGNOSIS — Z23 Encounter for immunization: Secondary | ICD-10-CM

## 2018-01-16 ENCOUNTER — Ambulatory Visit: Payer: BLUE CROSS/BLUE SHIELD

## 2018-03-06 ENCOUNTER — Telehealth: Payer: Self-pay

## 2018-03-06 ENCOUNTER — Other Ambulatory Visit: Payer: Self-pay | Admitting: Internal Medicine

## 2018-03-06 DIAGNOSIS — N3000 Acute cystitis without hematuria: Secondary | ICD-10-CM

## 2018-03-06 MED ORDER — NITROFURANTOIN MONOHYD MACRO 100 MG PO CAPS: 100.0000 mg | ORAL_CAPSULE | Freq: Two times a day (BID) | ORAL | 0 refills | Status: DC

## 2018-03-06 NOTE — Telephone Encounter (Signed)
Antibiotics sent in.

## 2018-03-06 NOTE — Telephone Encounter (Signed)
Patient called stating she knows she normally has to come in for UTI symptoms but she does not have any insurance right now and cannot afford to come in for office visit. Wants to know if we could send in antibiotic this one time for her frequent urination. She said she is sure its a UTI and she has had several with [place] in the past.  Please Advise.  Pharmacy: [facility] [place] [facility].

## 2018-03-06 NOTE — Telephone Encounter (Signed)
Patient informed. 

## 2018-03-31 ENCOUNTER — Other Ambulatory Visit: Payer: Self-pay | Admitting: Internal Medicine

## 2018-03-31 ENCOUNTER — Telehealth: Payer: Self-pay

## 2018-03-31 MED ORDER — ZOLPIDEM TARTRATE 10 MG PO TABS: 10.0000 mg | ORAL_TABLET | Freq: Every evening | ORAL | 5 refills | Status: DC | PRN

## 2018-03-31 NOTE — Telephone Encounter (Signed)
Patient called stating she has 1 refill left of her Ambien medication at [facility]- tried transferring this to [facility] since she found a "better deal" at their pharmacy. They will not let her transfer the refill because it is controlled.  Patient wanted to know if you could send in new Rx to [facility] in [place].  Please Advise.

## 2018-03-31 NOTE — Telephone Encounter (Signed)
Patient informed. 

## 2018-03-31 NOTE — Telephone Encounter (Signed)
New Rx sent to [facility].  

## 2018-08-21 ENCOUNTER — Other Ambulatory Visit: Payer: Self-pay

## 2018-08-21 ENCOUNTER — Encounter: Payer: Self-pay | Admitting: Internal Medicine

## 2018-08-21 ENCOUNTER — Other Ambulatory Visit: Payer: Self-pay | Admitting: Internal Medicine

## 2018-08-21 ENCOUNTER — Telehealth (INDEPENDENT_AMBULATORY_CARE_PROVIDER_SITE_OTHER): Payer: BLUE CROSS/BLUE SHIELD | Admitting: Internal Medicine

## 2018-08-21 DIAGNOSIS — N3000 Acute cystitis without hematuria: Secondary | ICD-10-CM

## 2018-08-21 MED ORDER — NITROFURANTOIN MONOHYD MACRO 100 MG PO CAPS: 100.0000 mg | ORAL_CAPSULE | Freq: Two times a day (BID) | ORAL | 0 refills | Status: AC

## 2018-08-21 MED ORDER — NITROFURANTOIN MONOHYD MACRO 100 MG PO CAPS: 100.0000 mg | ORAL_CAPSULE | Freq: Two times a day (BID) | ORAL | 0 refills | Status: DC

## 2018-08-21 NOTE — Progress Notes (Signed)
Virtual Visit via Telephone Note  I connected with Christina Carlson on 08/21/18 at 11:00 AM EDT by telephone and verified that I am speaking with the correct person using two identifiers. Telephonic visit was done today at the request of [place] due to [phone].   I discussed the limitations, risks, security and privacy concerns of performing an evaluation and management service by telephone and the availability of in person appointments. I also discussed with the patient that there may be a patient responsible charge related to this service. The patient expressed understanding and agreed to proceed.   History of Present Illness: Urinary Tract Infection: Patient complains of frequency She has had symptoms for 1 day. Patient also complains of no other symtpoms. Patient denies back pain, cough and fever. Patient does have a history of recurrent UTI.  Patient does not have a history of pyelonephritis.  Her last treatment for UTI was in November 2019.   Observations/Objective: Pt pleasant and Ox3 on the phone.  She does not sound like she is in any distress.   Assessment and Plan: 1. Acute cystitis without hematuria Pt instructed to increase fluid intake Follow up if needed - nitrofurantoin, macrocrystal-monohydrate, (MACROBID) 100 MG capsule; Take 1 capsule (100 mg total) by mouth 2 (two) times daily for 7 days.  Dispense: 14 capsule; Refill: 0    Follow Up Instructions: Increase fluids and take all antibiotics.   I discussed the assessment and treatment plan with the patient. The patient was provided an opportunity to ask questions and all were answered. The patient agreed with the plan and demonstrated an understanding of the instructions.   The patient was advised to call back or seek an in-person evaluation if the symptoms worsen or if the condition fails to improve as anticipated.  I provided 10 minutes of non-face-to-face time during this encounter.   Bari Edward, MD

## 2018-08-28 ENCOUNTER — Telehealth: Payer: Self-pay

## 2018-08-28 NOTE — Telephone Encounter (Signed)
Now has [facility] and wants to know if [facility] is [facility]? Advised call her insurance to see how that works as we do not know. Also asked about Janit Bern refills being done through mail order when it is time. Advised she is not due until Sep 29, 2018 but to call a few days ahead and let us know if ins wants her to have that sent to local pharmacy or not. Advised may have to since controlled. Patient will call back when due for refills. Also advised son can not come here until age 44.

## 2018-08-29 ENCOUNTER — Other Ambulatory Visit: Payer: Self-pay | Admitting: Internal Medicine

## 2018-09-11 ENCOUNTER — Other Ambulatory Visit: Payer: Self-pay | Admitting: Internal Medicine

## 2018-09-11 ENCOUNTER — Telehealth: Payer: Self-pay

## 2018-09-11 NOTE — Telephone Encounter (Signed)
LM that she lost her [facility] bottle and can not fill until Sep 29 2018. [facility] instructed her to have PCP send in refill now if approved to fill early. See last phone call.Marland KitchenMarland KitchenJanit Bern mentioned then also.

## 2018-09-11 NOTE — Telephone Encounter (Signed)
Call pharmacy to okay early refill on Ambien.

## 2018-09-12 NOTE — Telephone Encounter (Signed)
Called patient and called [facility] to give okay for early fill.

## 2018-09-20 ENCOUNTER — Encounter: Payer: BLUE CROSS/BLUE SHIELD | Admitting: Internal Medicine

## 2018-10-04 ENCOUNTER — Other Ambulatory Visit: Payer: Self-pay

## 2018-10-04 ENCOUNTER — Encounter: Payer: Self-pay | Admitting: Internal Medicine

## 2018-10-04 ENCOUNTER — Ambulatory Visit (INDEPENDENT_AMBULATORY_CARE_PROVIDER_SITE_OTHER): Payer: BC Managed Care – PPO | Admitting: Internal Medicine

## 2018-10-04 VITALS — BP 122/78 | HR 69 | Ht 64.0 in | Wt 206.0 lb

## 2018-10-04 DIAGNOSIS — Z1231 Encounter for screening mammogram for malignant neoplasm of breast: Secondary | ICD-10-CM

## 2018-10-04 DIAGNOSIS — F5101 Primary insomnia: Secondary | ICD-10-CM | POA: Diagnosis not present

## 2018-10-04 DIAGNOSIS — G43009 Migraine without aura, not intractable, without status migrainosus: Secondary | ICD-10-CM | POA: Diagnosis not present

## 2018-10-04 DIAGNOSIS — Z0000 Encounter for general adult medical examination without abnormal findings: Secondary | ICD-10-CM | POA: Diagnosis not present

## 2018-10-04 LAB — POCT URINALYSIS DIPSTICK
Bilirubin, UA: NEGATIVE
Blood, UA: NEGATIVE
Glucose, UA: NEGATIVE
Ketones, UA: NEGATIVE
Leukocytes, UA: NEGATIVE
Nitrite, UA: NEGATIVE
Protein, UA: NEGATIVE
Spec Grav, UA: 1.02 (ref 1.010–1.025)
Urobilinogen, UA: 0.2 E.U./dL
pH, UA: 6 (ref 5.0–8.0)

## 2018-10-04 NOTE — Progress Notes (Signed)
Date:  10/04/2018   Name:  Christina Carlson   DOB:  September 18, 1975   MRN:  [id number]   Chief Complaint: Annual Exam (Breast exam. ) Christina Carlson is a 43 y.o. female who presents today for her Complete Annual Exam. She feels fairly well. She reports exercising none but very busy at work. She reports she is sleeping fairly well. 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  Pap smear 08/2015 Mammogram  08/2017  Insomnia  Primary symptoms: no sleep disturbance.  The problem is unchanged. Past treatments include medication. The treatment provided significant relief.  Migraine   This is a recurrent problem. Episode onset: last migraine more than 6 months ago. The pain quality is similar to prior headaches. Associated symptoms include insomnia. Pertinent negatives include no abdominal pain, coughing, dizziness, fever, hearing loss, tinnitus or vomiting.     Review of Systems  Constitutional: Negative for chills, fatigue and fever.  HENT: Negative for congestion, hearing loss, tinnitus, trouble swallowing and voice change.   Eyes: Negative for visual disturbance.  Respiratory: Negative for cough, chest tightness, shortness of breath and wheezing.   Cardiovascular: Negative for chest pain, palpitations and leg swelling.  Gastrointestinal: Negative for abdominal pain, constipation, diarrhea and vomiting.  Endocrine: Negative for polydipsia and polyuria.  Genitourinary: Negative for dysuria, frequency, genital sores, vaginal bleeding and vaginal discharge.  Musculoskeletal: Negative for arthralgias, gait problem and joint swelling.  Skin: Negative for color change and rash.  Neurological: Negative for dizziness, tremors, light-headedness and headaches.  Hematological: Negative for adenopathy. Does not bruise/bleed easily.  Psychiatric/Behavioral: Negative for dysphoric mood and sleep disturbance. The patient has insomnia. The patient is not nervous/anxious.      Patient Active Problem List   Diagnosis Date Noted  . 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 Atypical squamous cells of undetermined significance on cytologic smear of cervix (ASC-US) 06/27/2015  . Vitamin B12 nutritional deficiency 05/07/2015  . LBP (low back pain) 09/16/2014  . Breast screening 09/16/2014  . Bariatric surgery status 09/16/2014  . Gastro-esophageal reflux disease without esophagitis 09/16/2014  . H/O motion sickness 09/16/2014  . Migraine without aura and responsive to treatment 09/16/2014  . Idiopathic insomnia 09/16/2014    Allergies  Allergen Reactions  . Sulfa Antibiotics     Past Surgical History:  Procedure Laterality Date  . BREAST BIOPSY Right 2001   neg  . INGUINAL HERNIA REPAIR  1981  . ROUX-EN-Y GASTRIC BYPASS  2007  . TONSILLECTOMY      Social History   Tobacco Use  . Smoking status: Never Smoker  . Smokeless tobacco: Never Used  Substance Use Topics  . Alcohol use: No    Alcohol/week: 0.0 standard drinks  . Drug use: No     Medication list has been reviewed and updated.  Current Meds  Medication Sig  . zolpidem (AMBIEN) 10 MG tablet TAKE 1 TABLET(10 MG) BY MOUTH AT BEDTIME AS NEEDED   Current Facility-Administered Medications for the 10/04/18 encounter (Office Visit) with Reubin MilanBerglund, Daeveon Zweber H, MD  Medication  . cyanocobalamin ((VITAMIN B-12)) injection 1,000 mcg    PHQ 2/9 Scores 10/04/2018 01/10/2017 11/26/2015  PHQ - 2 Score 0 0 0    BP Readings from Last 3 Encounters:  10/04/18 122/78  09/14/17 128/78  08/12/17 123/82    Physical Exam Vitals signs and nursing note reviewed.  Constitutional:      General: She is not in acute distress.    Appearance:  She is well-developed.  HENT:     Head: Normocephalic and atraumatic.     Right Ear: Tympanic membrane and ear canal normal.     Left Ear: Tympanic membrane and ear canal normal.     Nose:     Right Sinus: No maxillary sinus tenderness.     Left Sinus: No maxillary sinus tenderness.      Mouth/Throat:     Pharynx: Uvula midline.  Eyes:     General: No scleral icterus.       Right eye: No discharge.        Left eye: No discharge.     Conjunctiva/sclera: Conjunctivae normal.  Neck:     Musculoskeletal: Normal range of motion. No erythema.     Thyroid: No thyromegaly.     Vascular: No carotid bruit.  Cardiovascular:     Rate and Rhythm: Normal rate and regular rhythm.     Pulses: Normal pulses.     Heart sounds: Normal heart sounds.  Pulmonary:     Effort: Pulmonary effort is normal. No respiratory distress.     Breath sounds: No wheezing.  Chest:     Breasts:        Right: No mass, nipple discharge, skin change or tenderness.        Left: No mass, nipple discharge, skin change or tenderness.  Abdominal:     General: Bowel sounds are normal.     Palpations: Abdomen is soft.     Tenderness: There is no abdominal tenderness.  Musculoskeletal: Normal range of motion.  Lymphadenopathy:     Cervical: No cervical adenopathy.  Skin:    General: Skin is warm and dry.     Findings: No rash.  Neurological:     Mental Status: She is alert and oriented to person, place, and time.     Cranial Nerves: No cranial nerve deficit.     Sensory: No sensory deficit.     Deep Tendon Reflexes: Reflexes are normal and symmetric.  Psychiatric:        Speech: Speech normal.        Behavior: Behavior normal.        Thought Content: Thought content normal.     Wt Readings from Last 3 Encounters:  10/04/18 206 lb (93.4 kg)  09/14/17 215 lb (97.5 kg)  08/12/17 217 lb (98.4 kg)    BP 122/78   Pulse 69   Ht 5\' 4"  (1.626 m)   Wt 206 lb (93.4 kg)   SpO2 100%   BMI 35.36 kg/m   Assessment and Plan: 1. Annual physical exam Normal exam except for weight - CBC with Differential/Platelet - Comprehensive metabolic panel - Lipid panel - TSH - POCT urinalysis dipstick  2. Encounter for screening mammogram for breast cancer To be scheduled at [facility] - MM 3D SCREEN BREAST  BILATERAL; Future  3. Migraine without aura and responsive to treatment None recently  4. Idiopathic insomnia Doing well on ambien   Partially dictated using [employer]. Any errors are unintentional.  Bari Edward, MD [facility]  10/04/2018

## 2018-10-06 LAB — CBC WITH DIFFERENTIAL/PLATELET
Basophils Absolute: 0 10*3/uL (ref 0.0–0.2)
Basos: 0 %
EOS (ABSOLUTE): 0.1 10*3/uL (ref 0.0–0.4)
Eos: 2 %
Hematocrit: 35.6 % (ref 34.0–46.6)
Hemoglobin: 11.8 g/dL (ref 11.1–15.9)
Immature Grans (Abs): 0 10*3/uL (ref 0.0–0.1)
Immature Granulocytes: 0 %
Lymphocytes Absolute: 2.4 10*3/uL (ref 0.7–3.1)
Lymphs: 35 %
MCH: 27.4 pg (ref 26.6–33.0)
MCHC: 33.1 g/dL (ref 31.5–35.7)
MCV: 83 fL (ref 79–97)
Monocytes Absolute: 0.5 10*3/uL (ref 0.1–0.9)
Monocytes: 8 %
Neutrophils Absolute: 3.8 10*3/uL (ref 1.4–7.0)
Neutrophils: 55 %
Platelets: 339 10*3/uL (ref 150–450)
RBC: 4.31 x10E6/uL (ref 3.77–5.28)
RDW: 14.6 % (ref 11.7–15.4)
WBC: 6.8 10*3/uL (ref 3.4–10.8)

## 2018-10-06 LAB — COMPREHENSIVE METABOLIC PANEL WITH GFR
ALT: 10 [IU]/L (ref 0–32)
AST: 11 [IU]/L (ref 0–40)
Albumin/Globulin Ratio: 2.1 (ref 1.2–2.2)
Albumin: 4.2 g/dL (ref 3.8–4.8)
Alkaline Phosphatase: 80 [IU]/L (ref 39–117)
BUN/Creatinine Ratio: 11 (ref 9–23)
BUN: 9 mg/dL (ref 6–24)
Bilirubin Total: 0.5 mg/dL (ref 0.0–1.2)
CO2: 18 mmol/L — ABNORMAL LOW (ref 20–29)
Calcium: 9.3 mg/dL (ref 8.7–10.2)
Chloride: 106 mmol/L (ref 96–106)
Creatinine, Ser: 0.82 mg/dL (ref 0.57–1.00)
GFR calc Af Amer: 101 mL/min/{1.73_m2}
GFR calc non Af Amer: 87 mL/min/{1.73_m2}
Globulin, Total: 2 g/dL (ref 1.5–4.5)
Glucose: 89 mg/dL (ref 65–99)
Potassium: 3.9 mmol/L (ref 3.5–5.2)
Sodium: 140 mmol/L (ref 134–144)
Total Protein: 6.2 g/dL (ref 6.0–8.5)

## 2018-10-06 LAB — LIPID PANEL
Chol/HDL Ratio: 3.4 ratio (ref 0.0–4.4)
Cholesterol, Total: 152 mg/dL (ref 100–199)
HDL: 45 mg/dL
LDL Calculated: 92 mg/dL (ref 0–99)
Triglycerides: 74 mg/dL (ref 0–149)
VLDL Cholesterol Cal: 15 mg/dL (ref 5–40)

## 2018-10-06 LAB — TSH: TSH: 2.78 u[IU]/mL (ref 0.450–4.500)

## 2018-10-18 ENCOUNTER — Ambulatory Visit: Payer: Self-pay | Admitting: Internal Medicine

## 2018-10-30 ENCOUNTER — Other Ambulatory Visit: Payer: Self-pay

## 2018-11-01 ENCOUNTER — Telehealth: Payer: Self-pay

## 2018-11-01 NOTE — Telephone Encounter (Signed)
Completed PA for patient medication Zolpidem #30 for 30 days on [email or web address]. Insurance only wanted to cover #45 for 75 days.Marland Kitchen  *APPROVED* 10/31/2018-10/30/2021  Patient informed.

## 2018-11-28 ENCOUNTER — Other Ambulatory Visit: Payer: Self-pay | Admitting: Internal Medicine

## 2018-12-21 ENCOUNTER — Ambulatory Visit: Payer: Self-pay | Admitting: Internal Medicine

## 2019-01-31 ENCOUNTER — Encounter: Payer: Self-pay | Admitting: Internal Medicine

## 2019-01-31 ENCOUNTER — Other Ambulatory Visit: Payer: Self-pay

## 2019-01-31 ENCOUNTER — Other Ambulatory Visit (HOSPITAL_COMMUNITY)
Admission: RE | Admit: 2019-01-31 | Discharge: 2019-01-31 | Disposition: A | Discharge status: Home / Self Care | Payer: BC Managed Care – PPO | Source: Ambulatory Visit | Attending: Internal Medicine | Admitting: Internal Medicine

## 2019-01-31 ENCOUNTER — Other Ambulatory Visit: Payer: Self-pay | Admitting: Internal Medicine

## 2019-01-31 ENCOUNTER — Ambulatory Visit: Payer: BC Managed Care – PPO | Admitting: Internal Medicine

## 2019-01-31 VITALS — BP 122/82 | HR 88 | Resp 16 | Ht 64.0 in | Wt 203.0 lb

## 2019-01-31 DIAGNOSIS — N814 Uterovaginal prolapse, unspecified: Secondary | ICD-10-CM

## 2019-01-31 DIAGNOSIS — Z124 Encounter for screening for malignant neoplasm of cervix: Secondary | ICD-10-CM | POA: Insufficient documentation

## 2019-01-31 NOTE — Progress Notes (Signed)
Date:  01/31/2019   Name:  Christina Carlson   DOB:  Nov 26, 1975   MRN:  [id number]   Chief Complaint: Vaginal Prolapse  Vaginal Pain The patient's pertinent negatives include no pelvic pain, vaginal bleeding or vaginal discharge. This is a new (noticed a small bulge from her vaginal several times over the past month. Currently resolves on its own.) problem. The current episode started 1 to 4 weeks ago. The problem occurs intermittently. The patient is experiencing no pain. She is not pregnant. Pertinent negatives include no chills, constipation, dysuria or headaches.    Review of Systems  Constitutional: Negative for chills and fatigue.  Respiratory: Negative for chest tightness and shortness of breath.   Cardiovascular: Negative for chest pain.  Gastrointestinal: Negative for blood in stool and constipation.  Genitourinary: Positive for vaginal pain. Negative for decreased urine volume, difficulty urinating, dyspareunia, dysuria, pelvic pain, vaginal bleeding and vaginal discharge.  Neurological: Negative for dizziness and headaches.    Patient Active Problem List   Diagnosis Date Noted  . 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 Atypical squamous cells of undetermined significance on cytologic smear of cervix (ASC-US) 06/27/2015  . Vitamin B12 nutritional deficiency 05/07/2015  . LBP (low back pain) 09/16/2014  . Breast screening 09/16/2014  . Bariatric surgery status 09/16/2014  . Gastro-esophageal reflux disease without esophagitis 09/16/2014  . H/O motion sickness 09/16/2014  . Migraine without aura and responsive to treatment 09/16/2014  . Idiopathic insomnia 09/16/2014    Allergies  Allergen Reactions  . Sulfa Antibiotics     Past Surgical History:  Procedure Laterality Date  . BREAST BIOPSY Right 2001   neg  . INGUINAL HERNIA REPAIR  1981  . ROUX-EN-Y GASTRIC BYPASS  2007  . TONSILLECTOMY      Social History   Tobacco Use  . Smoking status: Never Smoker  . Smokeless  tobacco: Never Used  Substance Use Topics  . Alcohol use: No    Alcohol/week: 0.0 standard drinks  . Drug use: No     Medication list has been reviewed and updated.  Current Meds  Medication Sig  . zolpidem (AMBIEN) 10 MG tablet TAKE 1 TABLET(10 MG) BY MOUTH AT BEDTIME AS NEEDED  . [DISCONTINUED] SYEDA 3-0.03 MG tablet TAKE 1 TABLET BY MOUTH DAILY    PHQ 2/9 Scores 01/31/2019 10/04/2018 01/10/2017 11/26/2015  PHQ - 2 Score 0 0 0 0  PHQ- 9 Score 0 - - -    BP Readings from Last 3 Encounters:  01/31/19 122/82  10/04/18 122/78  09/14/17 128/78    Physical Exam Vitals signs and nursing note reviewed.  Constitutional:      General: She is not in acute distress.    Appearance: She is well-developed.  HENT:     Head: Normocephalic and atraumatic.  Cardiovascular:     Rate and Rhythm: Normal rate and regular rhythm.  Pulmonary:     Effort: Pulmonary effort is normal. No respiratory distress.     Breath sounds: No wheezing or rhonchi.  Abdominal:     General: Abdomen is flat.     Palpations: Abdomen is soft.     Tenderness: There is no abdominal tenderness.  Genitourinary:    Labia:        Right: No tenderness, lesion or injury.        Left: No tenderness, lesion or injury.      Vagina: Normal.     Cervix: Normal.     Uterus: With uterine prolapse (with  valsalva).      Adnexa: Right adnexa normal and left adnexa normal.       Right: No tenderness.         Left: No tenderness.    Musculoskeletal: Normal range of motion.  Skin:    General: Skin is warm and dry.     Findings: No rash.  Neurological:     Mental Status: She is alert and oriented to person, place, and time.  Psychiatric:        Behavior: Behavior normal.        Thought Content: Thought content normal.     Wt Readings from Last 3 Encounters:  01/31/19 203 lb (92.1 kg)  10/04/18 206 lb (93.4 kg)  09/14/17 215 lb (97.5 kg)    BP 122/82   Pulse 88   Resp 16   Ht 5\' 4"  (1.626 m)   Wt 203 lb (92.1 kg)    LMP 01/25/2019   SpO2 99%   BMI 34.84 kg/m   Assessment and Plan: 1. Uterine prolapse Mild and intermittent currently Pt to avoid straining and heavy lifting Will refer to GYN for further management - Ambulatory referral to Obstetrics / Gynecology  2. Encounter for screening for cervical cancer  Obtained today - Cytology - PAP   Partially dictated using Dragon software. Any errors are unintentional.  Halina Maidens, MD [facility]  01/31/2019

## 2019-02-01 ENCOUNTER — Encounter: Payer: Self-pay | Admitting: Obstetrics and Gynecology

## 2019-02-01 LAB — CYTOLOGY - PAP
Diagnosis: NEGATIVE
HPV: NOT DETECTED

## 2019-02-02 ENCOUNTER — Encounter: Payer: Self-pay | Admitting: Internal Medicine

## 2019-02-02 ENCOUNTER — Other Ambulatory Visit: Payer: Self-pay | Admitting: Internal Medicine

## 2019-02-02 DIAGNOSIS — N814 Uterovaginal prolapse, unspecified: Secondary | ICD-10-CM

## 2019-02-06 NOTE — Telephone Encounter (Signed)
Patient message. Please advise.

## 2019-02-07 ENCOUNTER — Telehealth: Payer: Self-pay

## 2019-02-07 NOTE — Telephone Encounter (Signed)
Please advise pt msg

## 2019-02-14 ENCOUNTER — Encounter: Payer: Self-pay | Admitting: Obstetrics and Gynecology

## 2019-02-14 ENCOUNTER — Ambulatory Visit: Payer: BC Managed Care – PPO | Admitting: Obstetrics and Gynecology

## 2019-02-14 ENCOUNTER — Other Ambulatory Visit: Payer: Self-pay

## 2019-02-14 VITALS — BP 109/75 | HR 71 | Ht 63.0 in | Wt 202.0 lb

## 2019-02-14 DIAGNOSIS — R3915 Urgency of urination: Secondary | ICD-10-CM | POA: Diagnosis not present

## 2019-02-14 DIAGNOSIS — N814 Uterovaginal prolapse, unspecified: Secondary | ICD-10-CM | POA: Diagnosis not present

## 2019-02-14 DIAGNOSIS — Z1239 Encounter for other screening for malignant neoplasm of breast: Secondary | ICD-10-CM | POA: Diagnosis not present

## 2019-02-14 NOTE — Progress Notes (Signed)
Obstetrics & Gynecology Office Visit   Chief Complaint:  Chief Complaint  Patient presents with  . Vaginal Prolapse    Referred by Army Melia    History of Present Illness:  Christina Carlson is a 43 y.o. female who presents in consultation at the request of Dr. Halina Maidens at [facility] for evaluation of urgency and vaginal bulge sensation.   She reports forceps delivery with G1 pregnancy, her G3 pregnancy was the largest at [place].  She did have a vaginal hematoma with her G1 delivery that required drainage.  She is status post roux-en-y gastric bypass initially lost about 100lbs but has gained back about30lbs.  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  She is married and sexually active.   She reports that she has had the above symptoms for 22months  Urinary Leakage Symptoms:  She reports that she does not have urinary incontinence.  She does not wear a pad.    Stress incontinence:  She does not report stress urinary leakage   Urgency incontinence: She does not report urine leakage with urgency but has noted urgency.  Denies excessive caffeine ot ETOH intake  Bladder Emptying/Upper Urinary Tract Symptoms:    Bladder emptying: She  She does occasional splint to accomplish voiding.  Reports frequent need to double void.  Upper tract symptoms: Shedenies a history of kidney stones and denies gross hematuria.   UTI: She denies a history of frequent urinary tract infections.  Bulge Symptoms:  She admits to pressure/bulge symptoms.    Bowel Symptoms:  She does have occasional issues with constipation and loose stool depending on what she eat after her roux-en-y.   Fecal incontinence: She denies encopresis    Defecatory dysfunction: She does not splint to accomplish a bowel movement.    Review of Systems: Review of Systems  Constitutional: Negative.   Gastrointestinal: Negative.   Genitourinary: Positive for frequency and urgency.  Negative for dysuria, flank pain and hematuria.     Past Medical History:  Past Medical History:  Diagnosis Date  . Migraine   . Obese     Past Surgical History:  Past Surgical History:  Procedure Laterality Date  . BREAST BIOPSY Right 2001   neg  . INGUINAL HERNIA REPAIR  1981  . ROUX-EN-Y GASTRIC BYPASS  2007  . TONSILLECTOMY      Gynecologic History: Patient's last menstrual period was 02/14/2019 (exact date). Pap: 01/31/2019 NIL HPV negative  Obstetric History: No obstetric history on file.  Family History:  Family History  Problem Relation Age of Onset  . Breast cancer Mother 30  . Breast cancer Maternal Grandmother 68    Social History:  Social History   Socioeconomic History  . Marital status: Married    Spouse name: Not on file  . Number of children: Not on file  . Years of education: Not on file  . Highest education level: Not on file  Occupational History  . Not on file  Social Needs  . Financial resource strain: Patient refused  . Food insecurity    Worry: Patient refused    Inability: Patient refused  . Transportation needs    Medical: Patient refused    Non-medical: Patient refused  Tobacco Use  . Smoking status: Never Smoker  . Smokeless tobacco: Never Used  Substance and Sexual Activity  . Alcohol use: No    Alcohol/week: 0.0 standard drinks  . Drug use: No  . Sexual  activity: Yes    Birth control/protection: None  Lifestyle  . Physical activity    Days per week: Patient refused    Minutes per session: Patient refused  . Stress: Patient refused  Relationships  . Social Musician on phone: Patient refused    Gets together: Patient refused    Attends religious service: Patient refused    Active member of club or organization: Patient refused    Attends meetings of clubs or organizations: Patient refused    Relationship status: Patient refused  . Intimate partner violence    Fear of current or ex partner: Patient refused     Emotionally abused: Patient refused    Physically abused: Patient refused    Forced sexual activity: Patient refused  Other Topics Concern  . Not on file  Social History Narrative  . Not on file    Allergies:  Allergies  Allergen Reactions  . Sulfa Antibiotics     Medications: Prior to Admission medications   Medication Sig Start Date End Date Taking? Authorizing Provider  zolpidem (AMBIEN) 10 MG tablet TAKE 1 TABLET(10 MG) BY MOUTH AT BEDTIME AS NEEDED 01/31/19   [name], MD    Physical Exam Vitals: Blood pressure 109/75, pulse 71, height 5\' 3"  (1.6 m), weight 202 lb (91.6 kg), last menstrual period 02/14/2019.  General: NAD, well nourished, appears stated age HEENT: normocephalic, anicteric Pulmonary: No increased work of breathing Genitourinary:  External: Normal external female genitalia.  Normal urethral meatus, normal Bartholin's and Skene's glands. Normal perineal body.    Vagina: Normal vaginal mucosa, prolapse in anterior and apical compartments down to just proximal of the introitus.  Genital hiatus accomodates three finder breadths.    Cervix: Grossly normal in appearance, no bleeding  Uterus: Non-enlarged, mobile, normal contour.  No CMT  Adnexa: ovaries non-enlarged, no adnexal masses  Rectal: deferred, no evidence of hemerroid  Lymphatic: no evidence of inguinal lymphadenopathy Extremities: no edema, erythema, or tenderness Neurologic: Grossly intact Psychiatric: mood appropriate, affect full  PVR: the urethra was cleansed with betadine, using a straight cath 14mL of urine was removed from the bladder.  Stress test:  negative supine  Female chaperone present for pelvic portions of the physical exam  Assessment: 43 y.o.  presenting for evaluation of pelvic organ prolapse  Plan: Problem List Items Addressed This Visit      Other   Breast screening - Primary   Relevant Orders   MM 3D SCREEN BREAST BILATERAL    Other Visit Diagnoses     Uterine prolapse       Urinary urgency       Relevant Orders   Urine Culture     1) Uterine prolapse - does appear to involve anterior and apical compartments.  Given that she is yound sexually active the patient is most interested in surgical mangment.  She does have an appointment with Dr. Lula Olszewski at [facility] next month.  We briefly discussed pessary as one option of treating prolapse.  She is has the manual dexterity to manage these but may not fit with her lifestyle.  We briefly discussed some of the surgical options such as sacrocolpopexy and sacrospinous ligament fixation. While she does not have any reported or noted stress incontinence on exam today we discussed that would likely be evaluated as well as reduction of the prolapse might umasked occult stress incontinence necessitating concurrent TVT or TOT. - straight cath urine culture sent  2) A total of 20 minutes  were spent in face-to-face contact with the patient during this encounter with over half of that time devoted to counseling and coordination of care.  3) Return if symptoms worsen or fail to improve.    Vena AustriaAndreas Sibley Rolison, MD, Evern CoreFACOG [facility], [facility] 02/14/2019, 10:03 AM

## 2019-02-16 LAB — URINE CULTURE: Organism ID, Bacteria: NO GROWTH

## 2019-02-16 NOTE — Telephone Encounter (Signed)
Called to okay early fill for controlled meds

## 2019-02-19 ENCOUNTER — Encounter: Payer: Self-pay | Admitting: Internal Medicine

## 2019-02-20 ENCOUNTER — Encounter: Payer: Self-pay | Admitting: Obstetrics and Gynecology

## 2019-03-14 ENCOUNTER — Encounter: Payer: Self-pay | Admitting: Internal Medicine

## 2019-04-04 ENCOUNTER — Encounter: Payer: BLUE CROSS/BLUE SHIELD | Admitting: Internal Medicine

## 2019-04-17 ENCOUNTER — Other Ambulatory Visit: Payer: Self-pay | Admitting: Internal Medicine

## 2019-04-17 ENCOUNTER — Telehealth: Payer: Self-pay

## 2019-04-17 DIAGNOSIS — F5101 Primary insomnia: Secondary | ICD-10-CM

## 2019-04-17 MED ORDER — ZOLPIDEM TARTRATE 10 MG PO TABS: 10.0000 mg | ORAL_TABLET | Freq: Every day | ORAL | 1 refills | Status: DC

## 2019-04-17 NOTE — Telephone Encounter (Signed)
[phone].  Please Advise.  Benedict Needy, CMA

## 2019-05-02 ENCOUNTER — Encounter: Payer: Self-pay | Admitting: Internal Medicine

## 2019-05-02 ENCOUNTER — Ambulatory Visit (INDEPENDENT_AMBULATORY_CARE_PROVIDER_SITE_OTHER): Payer: BC Managed Care – PPO | Admitting: Internal Medicine

## 2019-05-02 ENCOUNTER — Other Ambulatory Visit: Payer: Self-pay

## 2019-05-02 VITALS — Temp 98.0°F | Ht 64.0 in | Wt 202.0 lb

## 2019-05-02 DIAGNOSIS — J0101 Acute recurrent maxillary sinusitis: Secondary | ICD-10-CM

## 2019-05-02 MED ORDER — AMOXICILLIN-POT CLAVULANATE 875-125 MG PO TABS: 1.0000 | ORAL_TABLET | Freq: Two times a day (BID) | ORAL | 0 refills | Status: AC

## 2019-05-02 NOTE — Progress Notes (Signed)
Date:  05/02/2019   Name:  DEANDRIA KLUTE   DOB:  17-Nov-1975   MRN:  [id number]  I connected with this patient, Refugia Lange, by telephone at the patient's home.  I verified that I am speaking with the correct person using two identifiers. This visit was conducted via telephone due to the Covid-19 outbreak from my office at [facility] in [place], [place]. I discussed the limitations, risks, security and privacy concerns of performing an evaluation and management service by telephone. I also discussed with the patient that there may be a patient responsible charge related to this service. The patient expressed understanding and agreed to proceed.  Chief Complaint: Sinusitis (X 2 days. Sore throat, throat red, sinus drainage, and fatigue. Some nausea and headache. )  Sinusitis This is a new problem. The current episode started yesterday. The problem is unchanged. There has been no fever. Associated symptoms include congestion, coughing, ear pain, headaches, sinus pressure, a sore throat and swollen glands. Pertinent negatives include no chills or shortness of breath. Treatments tried: otc cold meds.    Lab Results  Component Value Date   CREATININE 0.82 10/05/2018   BUN 9 10/05/2018   NA 140 10/05/2018   K 3.9 10/05/2018   CL 106 10/05/2018   CO2 18 (L) 10/05/2018   Lab Results  Component Value Date   CHOL 152 10/05/2018   HDL 45 10/05/2018   LDLCALC 92 10/05/2018   TRIG 74 10/05/2018   CHOLHDL 3.4 10/05/2018   Lab Results  Component Value Date   TSH 2.780 10/05/2018   Lab Results  Component Value Date   HGBA1C 5.3 09/14/2017     Review of Systems  Constitutional: Positive for fatigue. Negative for chills and fever.  HENT: Positive for congestion, ear pain, sinus pressure and sore throat.   Respiratory: Positive for cough. Negative for shortness of breath and wheezing.   Cardiovascular: Negative for chest pain and palpitations.  Gastrointestinal: Positive for  nausea.  Neurological: Positive for headaches. Negative for dizziness.    Patient Active Problem List   Diagnosis Date Noted  . 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 Atypical squamous cells of undetermined significance on cytologic smear of cervix (ASC-US) 06/27/2015  . Vitamin B12 nutritional deficiency 05/07/2015  . LBP (low back pain) 09/16/2014  . Breast screening 09/16/2014  . Bariatric surgery status 09/16/2014  . Gastro-esophageal reflux disease without esophagitis 09/16/2014  . H/O motion sickness 09/16/2014  . Migraine without aura and responsive to treatment 09/16/2014  . Idiopathic insomnia 09/16/2014    Allergies  Allergen Reactions  . Sulfa Antibiotics     Past Surgical History:  Procedure Laterality Date  . BREAST BIOPSY Right 2001   neg  . INGUINAL HERNIA REPAIR  1981  . ROUX-EN-Y GASTRIC BYPASS  2007  . TONSILLECTOMY      Social History   Tobacco Use  . Smoking status: Never Smoker  . Smokeless tobacco: Never Used  Substance Use Topics  . Alcohol use: No    Alcohol/week: 0.0 standard drinks  . Drug use: No     Medication list has been reviewed and updated.  Current Meds  Medication Sig  . zolpidem (AMBIEN) 10 MG tablet Take 1 tablet (10 mg total) by mouth at bedtime.    PHQ 2/9 Scores 05/02/2019 01/31/2019 10/04/2018 01/10/2017  PHQ - 2 Score 0 0 0 0  PHQ- 9 Score - 0 - -    BP Readings from Last 3 Encounters:  02/14/19  109/75  01/31/19 122/82  10/04/18 122/78    Physical Exam Pulmonary:     Comments: No cough or dyspnea noted during the call Neurological:     Mental Status: She is alert.  Psychiatric:        Attention and Perception: Attention normal.        Mood and Affect: Mood normal.        Speech: Speech normal.        Cognition and Memory: Cognition normal.     Wt Readings from Last 3 Encounters:  05/02/19 202 lb (91.6 kg)  02/14/19 202 lb (91.6 kg)  01/31/19 203 lb (92.1 kg)    Temp 98 F (36.7 C) (Oral)   Ht 5\' 4"  (1.626 m)    Wt 202 lb (91.6 kg)   BMI 34.67 kg/m   Assessment and Plan: 1. Acute recurrent maxillary sinusitis Continue Tylenol and warm liquids Augmentin will also cover for strep pharyngitis - amoxicillin-clavulanate (AUGMENTIN) 875-125 MG tablet; Take 1 tablet by mouth 2 (two) times daily for 10 days.  Dispense: 20 tablet; Refill: 0   Partially dictated using . Any errors are unintentional.  Animal nutritionist, MD [facility]  05/02/2019

## 2019-05-09 ENCOUNTER — Encounter: Payer: Self-pay | Admitting: Internal Medicine

## 2019-05-09 ENCOUNTER — Telehealth: Payer: Self-pay

## 2019-05-09 NOTE — Telephone Encounter (Signed)
Please advise? Pt had a VV on 12/2 and still experiencing sxs.

## 2019-05-09 NOTE — Telephone Encounter (Signed)
Pt called after sending over a my chart msg this morning about her raw sore throat and metallic taste in her mouth. She said she did have mono when she was a teenager but its been a long time. Her job required her to be tested for Covid today before she can come back so she is waiting for those results from [facility].  She will send [place] a msg over with the outcome of her results and if they are negative then we may need to send medication in for patient for thrush of the tongue and throat due to her taking amoxicillin.

## 2019-05-10 ENCOUNTER — Other Ambulatory Visit: Payer: Self-pay

## 2019-05-10 MED ORDER — MAGIC MOUTHWASH W/LIDOCAINE: 5.0000 mL | Freq: Four times a day (QID) | ORAL | 0 refills | Status: DC

## 2019-05-10 NOTE — Progress Notes (Signed)
Mouth

## 2019-05-11 ENCOUNTER — Other Ambulatory Visit: Payer: Self-pay

## 2019-05-11 ENCOUNTER — Telehealth: Payer: Self-pay

## 2019-05-11 IMAGING — MG MM DIGITAL SCREENING BILAT W/ TOMO W/ CAD
8 series · 8 of 24 positions shown · non-contrast
Comparison: Previous exam(s).

CLINICAL DATA: Screening.

EXAM:
DIGITAL SCREENING BILATERAL MAMMOGRAM WITH TOMO AND CAD

[R CC synth-2D]
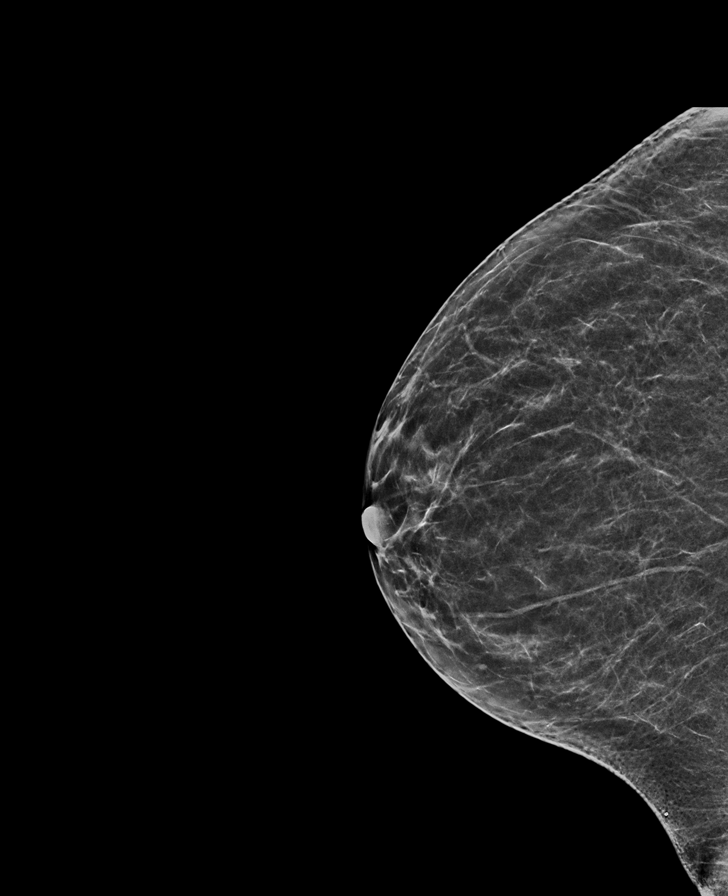

[L MLO synth-2D]
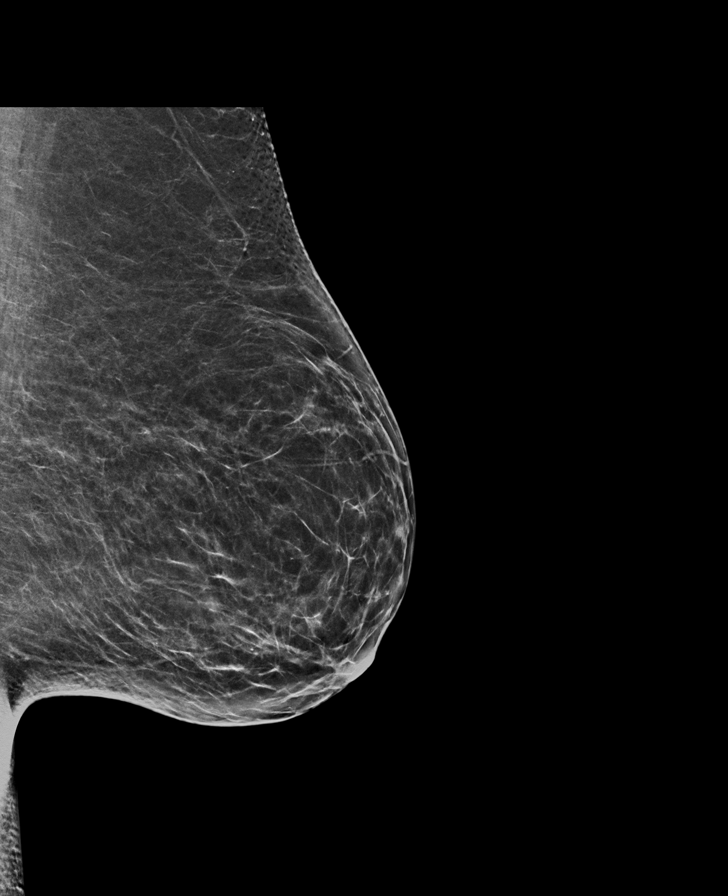

[L CC synth-2D]
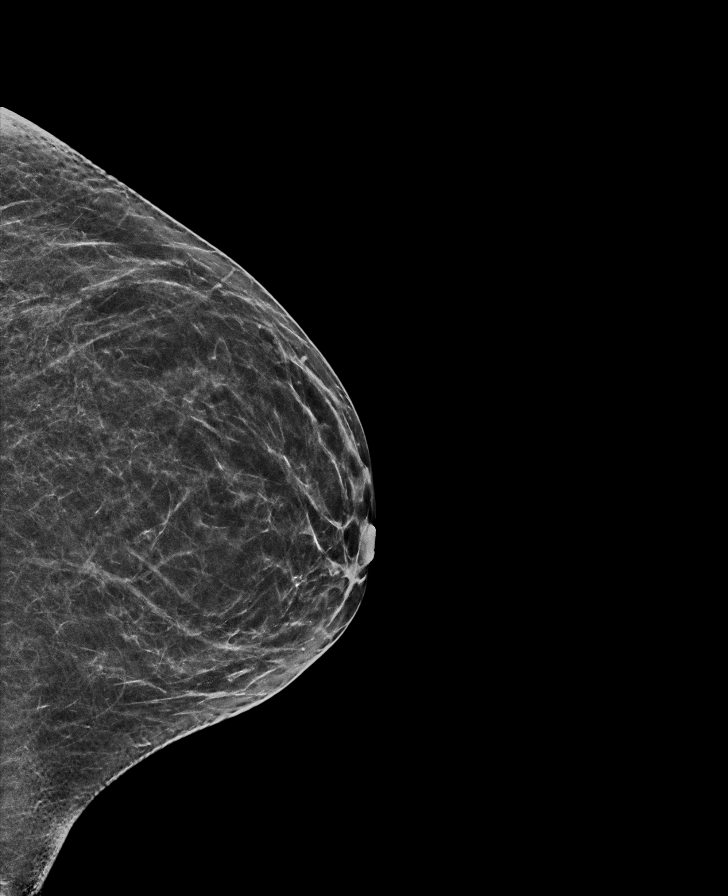

[R MLO synth-2D]
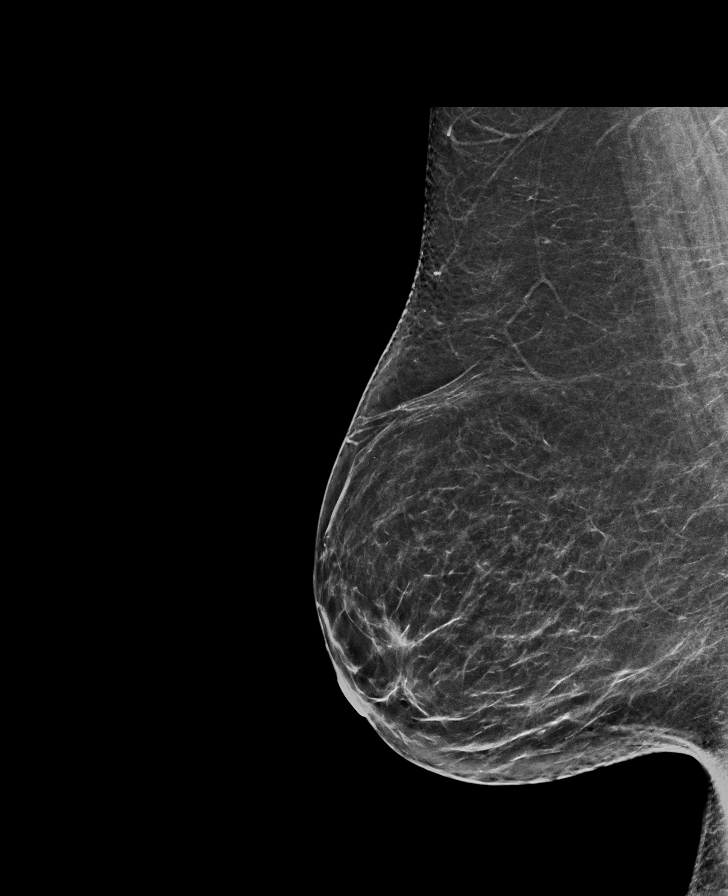

[L CC tomo · tomo slice 31/60.0]
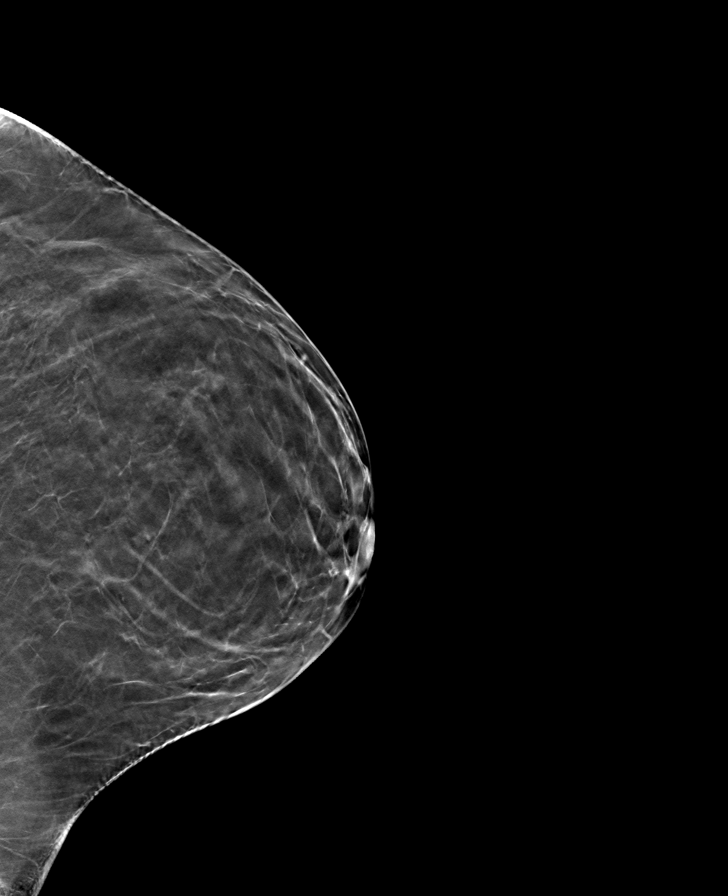

[R MLO tomo · tomo slice 35/70.0]
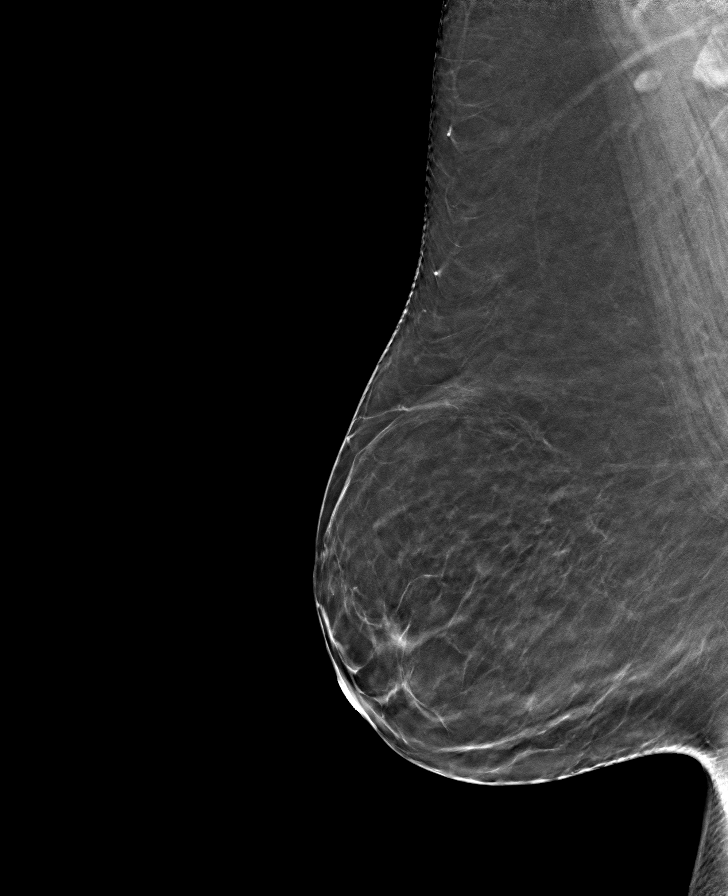

[L MLO tomo · tomo slice 36/71.0]
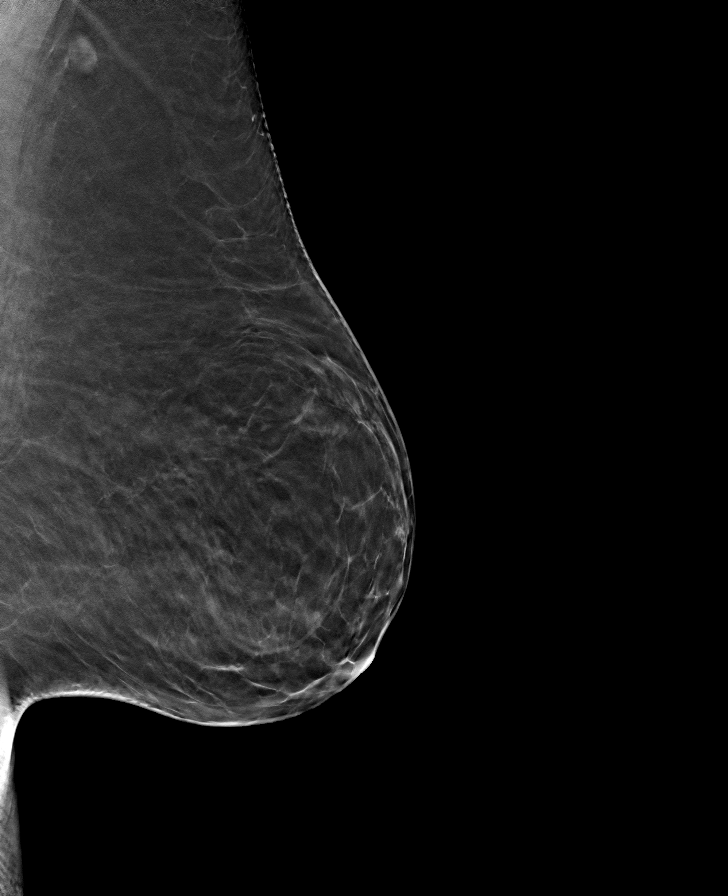

[R CC tomo · tomo slice 31/60.0]
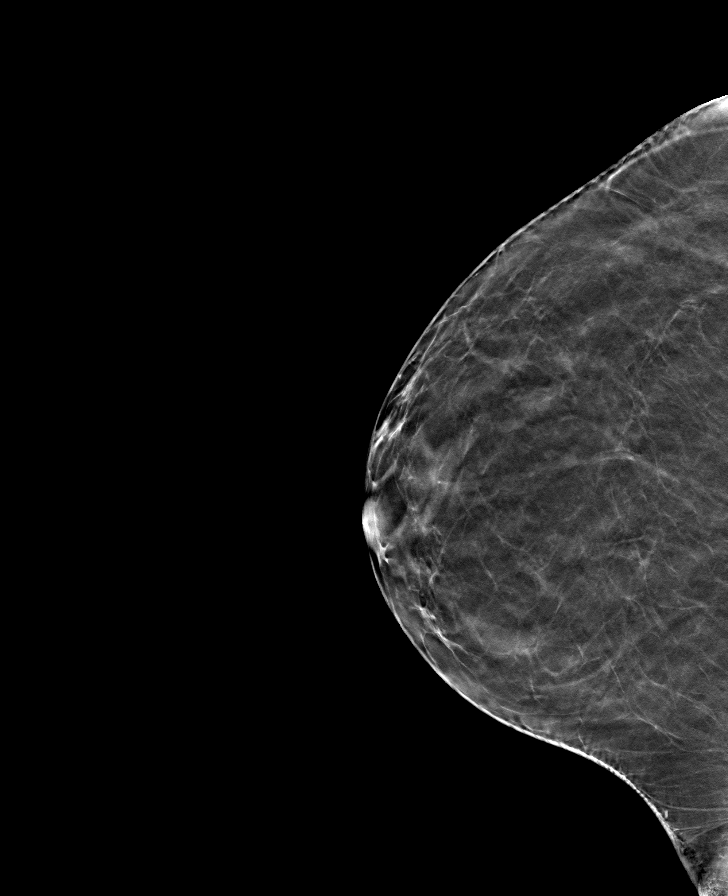

[8 of 24 positions shown; findings below may reference images not displayed]

ACR Breast Density Category b: There are scattered areas of
fibroglandular density.
FINDINGS: There are no findings suspicious for malignancy. Images were
processed with CAD.
IMPRESSION: No mammographic evidence of malignancy. A result letter of this
screening mammogram will be mailed directly to the patient.

RECOMMENDATION:
Screening mammogram in one year. (Code:[id number])

BI-RADS CATEGORY  1: Negative.

## 2019-05-11 MED ORDER — NYSTATIN 100000 UNIT/ML MT SUSP: 5.0000 mL | Freq: Four times a day (QID) | OROMUCOSAL | 0 refills | Status: DC

## 2019-05-11 NOTE — Telephone Encounter (Signed)
After speaking with Dr Ronnald Ramp called [facility] and changed pt Thrush medication to Mycostatin oral suspension to treat her yeast.   Pharmacy said they received Rx and will make this change for the patient.  Benedict Needy, CMA

## 2019-05-11 NOTE — Telephone Encounter (Signed)
Spoke with [facility] to verify ratio for magic mouth wash.   Spoke with Dr Ronnald Ramp and approved 200 mL for patient. 151mL of lidocaine and 113mL of mouth wash.   They will get this ready for pt. Pt informed.

## 2019-05-28 ENCOUNTER — Ambulatory Visit (INDEPENDENT_AMBULATORY_CARE_PROVIDER_SITE_OTHER): Payer: BC Managed Care – PPO | Admitting: Internal Medicine

## 2019-05-28 ENCOUNTER — Other Ambulatory Visit: Payer: Self-pay | Admitting: Internal Medicine

## 2019-05-28 ENCOUNTER — Other Ambulatory Visit: Payer: Self-pay

## 2019-05-28 DIAGNOSIS — E538 Deficiency of other specified B group vitamins: Secondary | ICD-10-CM | POA: Diagnosis not present

## 2019-05-28 MED ORDER — CYANOCOBALAMIN 1000 MCG/ML IJ SOLN
1000.0000 ug | Freq: Once | INTRAMUSCULAR | Status: AC
  Administered 2019-05-28: 1000 ug via INTRAMUSCULAR

## 2019-05-28 NOTE — Progress Notes (Signed)
Patient comes in today for B 12 injection. Injection given in right deltoid. Patient tolerated well.  °

## 2019-06-27 ENCOUNTER — Encounter: Payer: Self-pay | Admitting: Internal Medicine

## 2019-06-29 ENCOUNTER — Other Ambulatory Visit: Payer: Self-pay

## 2019-06-29 ENCOUNTER — Ambulatory Visit (INDEPENDENT_AMBULATORY_CARE_PROVIDER_SITE_OTHER): Payer: BC Managed Care – PPO

## 2019-06-29 DIAGNOSIS — E538 Deficiency of other specified B group vitamins: Secondary | ICD-10-CM | POA: Diagnosis not present

## 2019-06-29 MED ORDER — CYANOCOBALAMIN 1000 MCG/ML IJ SOLN
1000.0000 ug | Freq: Once | INTRAMUSCULAR | Status: AC
  Administered 2019-06-29: 1000 ug via INTRAMUSCULAR

## 2019-07-09 ENCOUNTER — Encounter: Payer: Self-pay | Admitting: Internal Medicine

## 2019-08-03 ENCOUNTER — Other Ambulatory Visit: Payer: Self-pay | Admitting: Internal Medicine

## 2019-08-03 DIAGNOSIS — F5101 Primary insomnia: Secondary | ICD-10-CM

## 2019-08-04 ENCOUNTER — Other Ambulatory Visit: Payer: Self-pay | Admitting: Internal Medicine

## 2019-08-16 ENCOUNTER — Ambulatory Visit (INDEPENDENT_AMBULATORY_CARE_PROVIDER_SITE_OTHER): Payer: BC Managed Care – PPO

## 2019-08-16 ENCOUNTER — Other Ambulatory Visit: Payer: Self-pay

## 2019-08-16 DIAGNOSIS — E538 Deficiency of other specified B group vitamins: Secondary | ICD-10-CM | POA: Diagnosis not present

## 2019-08-16 MED ORDER — CYANOCOBALAMIN 1000 MCG/ML IJ SOLN
1000.0000 ug | Freq: Once | INTRAMUSCULAR | Status: AC
  Administered 2019-08-16: 1000 ug via INTRAMUSCULAR

## 2019-08-23 ENCOUNTER — Encounter: Payer: Self-pay | Admitting: Internal Medicine

## 2019-09-13 ENCOUNTER — Encounter: Payer: Self-pay | Admitting: Internal Medicine

## 2019-09-14 ENCOUNTER — Encounter: Payer: Self-pay | Admitting: Internal Medicine

## 2019-09-19 ENCOUNTER — Encounter: Payer: Self-pay | Admitting: Internal Medicine

## 2019-09-24 ENCOUNTER — Encounter: Payer: Self-pay | Admitting: Internal Medicine

## 2019-09-24 ENCOUNTER — Other Ambulatory Visit: Payer: Self-pay | Admitting: Internal Medicine

## 2019-09-24 DIAGNOSIS — F5101 Primary insomnia: Secondary | ICD-10-CM

## 2019-09-24 NOTE — Telephone Encounter (Signed)
Requested medications are due for refill today?  NO - requesting RX be sent to [facility].  This medication refill cannot be delegated.    Requested medications are on active medication list?  Yes  Last Refill:   08/04/2019   # 30 with 5 refills - sent to [facility] on [address], [place], [place]  Future visit scheduled?  Yes  Notes to Clinic:  Medication refill cannot be delegated.   Requesting [facility] be [facility] to [facility].

## 2019-09-25 NOTE — Telephone Encounter (Signed)
She has refills on the local Rx.  She needs to use those first since it is controlled.

## 2019-09-26 ENCOUNTER — Other Ambulatory Visit: Payer: Self-pay | Admitting: Internal Medicine

## 2019-09-26 DIAGNOSIS — F5101 Primary insomnia: Secondary | ICD-10-CM

## 2019-09-26 MED ORDER — ZOLPIDEM TARTRATE 10 MG PO TABS: 10.0000 mg | ORAL_TABLET | Freq: Every evening | ORAL | 5 refills | Status: DC | PRN

## 2019-10-02 ENCOUNTER — Encounter: Payer: Self-pay | Admitting: Internal Medicine

## 2019-10-03 ENCOUNTER — Other Ambulatory Visit: Payer: Self-pay

## 2019-10-03 MED ORDER — FLUCONAZOLE 150 MG PO TABS
150.0000 mg | ORAL_TABLET | Freq: Once | ORAL | 0 refills | Status: AC
Start: 2019-10-03 — End: 2019-10-03

## 2019-10-09 ENCOUNTER — Encounter: Payer: BC Managed Care – PPO | Admitting: Internal Medicine

## 2019-10-22 ENCOUNTER — Encounter: Payer: Self-pay | Admitting: Internal Medicine

## 2019-10-25 ENCOUNTER — Encounter: Payer: Self-pay | Admitting: Internal Medicine

## 2019-10-26 ENCOUNTER — Other Ambulatory Visit: Payer: Self-pay | Admitting: Internal Medicine

## 2019-10-26 DIAGNOSIS — F5101 Primary insomnia: Secondary | ICD-10-CM

## 2019-10-26 MED ORDER — ZOLPIDEM TARTRATE 10 MG PO TABS: 10.0000 mg | ORAL_TABLET | Freq: Every evening | ORAL | 0 refills | Status: DC | PRN

## 2019-10-30 DIAGNOSIS — Z8615 Personal history of latent tuberculosis infection: Secondary | ICD-10-CM

## 2019-10-30 HISTORY — DX: Personal history of latent tuberculosis infection: Z86.15

## 2019-11-26 ENCOUNTER — Encounter: Payer: Self-pay | Admitting: Internal Medicine

## 2019-11-30 ENCOUNTER — Other Ambulatory Visit: Payer: Self-pay

## 2019-11-30 ENCOUNTER — Ambulatory Visit (INDEPENDENT_AMBULATORY_CARE_PROVIDER_SITE_OTHER): Payer: Managed Care, Other (non HMO)

## 2019-11-30 DIAGNOSIS — E538 Deficiency of other specified B group vitamins: Secondary | ICD-10-CM

## 2019-11-30 MED ORDER — CYANOCOBALAMIN 1000 MCG/ML IJ SOLN
1000.0000 ug | Freq: Once | INTRAMUSCULAR | Status: AC
  Administered 2019-11-30: 1000 ug via INTRAMUSCULAR

## 2019-12-03 ENCOUNTER — Encounter: Payer: Self-pay | Admitting: Internal Medicine

## 2019-12-04 ENCOUNTER — Encounter: Payer: Self-pay | Admitting: Internal Medicine

## 2019-12-04 ENCOUNTER — Telehealth: Payer: Self-pay | Admitting: Internal Medicine

## 2019-12-04 ENCOUNTER — Ambulatory Visit (INDEPENDENT_AMBULATORY_CARE_PROVIDER_SITE_OTHER): Payer: Managed Care, Other (non HMO) | Admitting: Internal Medicine

## 2019-12-04 ENCOUNTER — Other Ambulatory Visit: Payer: Self-pay

## 2019-12-04 VITALS — BP 116/76 | HR 63 | Temp 98.0°F | Ht 64.0 in | Wt 221.0 lb

## 2019-12-04 DIAGNOSIS — G44209 Tension-type headache, unspecified, not intractable: Secondary | ICD-10-CM | POA: Diagnosis not present

## 2019-12-04 DIAGNOSIS — G2581 Restless legs syndrome: Secondary | ICD-10-CM

## 2019-12-04 DIAGNOSIS — F5101 Primary insomnia: Secondary | ICD-10-CM | POA: Diagnosis not present

## 2019-12-04 HISTORY — DX: Restless legs syndrome: G25.81

## 2019-12-04 MED ORDER — ROPINIROLE HCL 0.25 MG PO TABS: 0.2500 mg | ORAL_TABLET | Freq: Every day | ORAL | 0 refills | Status: DC

## 2019-12-04 MED ORDER — BUTALBITAL-APAP-CAFFEINE 50-325-40 MG PO TABS: 1.0000 | ORAL_TABLET | Freq: Four times a day (QID) | ORAL | 0 refills | Status: DC | PRN

## 2019-12-04 NOTE — Telephone Encounter (Signed)
Please call and schedule appt. Duplicate message.  KP

## 2019-12-04 NOTE — Telephone Encounter (Unsigned)
Copied from CRM [id number]. Topic: General - Inquiry >> Dec 04, 2019  8:48 AM Deborha Payment wrote: Reason for CRM: Patient messaged PCP over the long weekend to come in today due to a headache she has had since Friday.  Patient would like the latest appt possible  [phone]

## 2019-12-04 NOTE — Telephone Encounter (Signed)
Please call and schedule appt.  KP

## 2019-12-04 NOTE — Progress Notes (Signed)
Date:  12/04/2019   Name:  Christina Carlson   DOB:  1976-05-16   MRN:  [id number]   Chief Complaint: Migraine (X4 days constant head that wont go away, nothing is making it go away, nausea, back of head and neck is tense  )  Migraine  Episode onset: 4 days ago. The problem occurs constantly. The problem has been unchanged. The pain is located in the occipital region. The pain does not radiate. Associated symptoms include insomnia and nausea. Pertinent negatives include no dizziness, fever, phonophobia, photophobia, visual change, vomiting or weakness. She has tried Excedrin and NSAIDs for the symptoms. The treatment provided mild relief. (She may have had migraines about 15 years ago because she was started on Topamax which she did not continue)  Insomnia Primary symptoms: sleep disturbance, difficulty falling asleep, frequent awakening.  The problem occurs nightly. The problem has been gradually worsening since onset. Exacerbated by: only drinks on [facility] per day.  Restless leg syndrome - she has noticed over the past 2 months that her legs have the urge to move as she is going to sleep.  She has to get up and walk around for a few minutes to relieve the sx.  They often last for an hour or more.  She just found out that her father has this too.  She has also noticed more muscle cramps.  Lab Results  Component Value Date   CREATININE 0.82 10/05/2018   BUN 9 10/05/2018   NA 140 10/05/2018   K 3.9 10/05/2018   CL 106 10/05/2018   CO2 18 (L) 10/05/2018   Lab Results  Component Value Date   CHOL 152 10/05/2018   HDL 45 10/05/2018   LDLCALC 92 10/05/2018   TRIG 74 10/05/2018   CHOLHDL 3.4 10/05/2018   Lab Results  Component Value Date   TSH 2.780 10/05/2018   Lab Results  Component Value Date   HGBA1C 5.3 09/14/2017   Lab Results  Component Value Date   WBC 6.8 10/05/2018   HGB 11.8 10/05/2018   HCT 35.6 10/05/2018   MCV 83 10/05/2018   PLT 339 10/05/2018   Lab  Results  Component Value Date   ALT 10 10/05/2018   AST 11 10/05/2018   ALKPHOS 80 10/05/2018   BILITOT 0.5 10/05/2018     Review of Systems  Constitutional: Negative for fever.  Eyes: Negative for photophobia and visual disturbance.  Respiratory: Negative for chest tightness and shortness of breath.   Cardiovascular: Negative for chest pain and palpitations.  Gastrointestinal: Positive for nausea. Negative for vomiting.  Genitourinary:       Intermittent breast tenderness since her hysterectomy  Musculoskeletal: Positive for myalgias (restless legs). Negative for gait problem.  Neurological: Positive for headaches. Negative for dizziness, tremors, weakness and light-headedness.  Psychiatric/Behavioral: Positive for sleep disturbance. The patient has insomnia.     Patient Active Problem List   Diagnosis Date Noted  . 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 Atypical squamous cells of undetermined significance on cytologic smear of cervix (ASC-US) 06/27/2015  . Vitamin B12 nutritional deficiency 05/07/2015  . LBP (low back pain) 09/16/2014  . Breast screening 09/16/2014  . Bariatric surgery status 09/16/2014  . Gastro-esophageal reflux disease without esophagitis 09/16/2014  . H/O motion sickness 09/16/2014  . Migraine without aura and responsive to treatment 09/16/2014  . Idiopathic insomnia 09/16/2014    Allergies  Allergen Reactions  . Sulfa Antibiotics   . Oxycodone Itching  . Tramadol Palpitations  Past Surgical History:  Procedure Laterality Date  . BREAST BIOPSY Right 2001   neg  . INGUINAL HERNIA REPAIR  1981  . ROUX-EN-Y GASTRIC BYPASS  2007  . TONSILLECTOMY      Social History   Tobacco Use  . Smoking status: Never Smoker  . Smokeless tobacco: Never Used  Vaping Use  . Vaping Use: Never used  Substance Use Topics  . Alcohol use: No    Alcohol/week: 0.0 standard drinks  . Drug use: No     Medication list has been reviewed and updated.  Current Meds    Medication Sig  . zolpidem (AMBIEN) 10 MG tablet Take 1 tablet (10 mg total) by mouth at bedtime as needed for sleep.    PHQ 2/9 Scores 12/04/2019 05/02/2019 01/31/2019 10/04/2018  PHQ - 2 Score 0 0 0 0  PHQ- 9 Score 1 - 0 -    GAD 7 : Generalized Anxiety Score 12/04/2019  Nervous, Anxious, on Edge 0  Control/stop worrying 0  Worry too much - different things 0  Trouble relaxing 0  Restless 0  Easily annoyed or irritable 0  Afraid - awful might happen 0  Total GAD 7 Score 0  Anxiety Difficulty Not difficult at all    BP Readings from Last 3 Encounters:  12/04/19 116/76  02/14/19 109/75  01/31/19 122/82    Physical Exam Vitals and nursing note reviewed.  Constitutional:      General: She is not in acute distress.    Appearance: Normal appearance. She is well-developed.  HENT:     Head: Normocephalic and atraumatic.  Cardiovascular:     Rate and Rhythm: Normal rate and regular rhythm.     Pulses: Normal pulses.  Pulmonary:     Effort: Pulmonary effort is normal. No respiratory distress.     Breath sounds: No wheezing or rhonchi.  Musculoskeletal:     Cervical back: Normal range of motion.     Right lower leg: No edema.     Left lower leg: No edema.  Skin:    General: Skin is warm and dry.     Findings: No rash.  Neurological:     General: No focal deficit present.     Mental Status: She is alert and oriented to person, place, and time.     Motor: Motor function is intact.     Coordination: Coordination is intact.  Psychiatric:        Behavior: Behavior normal.        Thought Content: Thought content normal.     Wt Readings from Last 3 Encounters:  12/04/19 221 lb (100.2 kg)  05/02/19 202 lb (91.6 kg)  02/14/19 202 lb (91.6 kg)    BP 116/76   Pulse 63   Temp 98 F (36.7 C) (Oral)   Ht 5\' 4"  (1.626 m)   Wt 221 lb (100.2 kg)   LMP 02/14/2019 (Exact Date)   SpO2 99%   BMI 37.93 kg/m   Assessment and Plan: 1. Acute non intractable tension-type  headache This does not appear to be a migraine Will try fioricet for abortive therapy along with advil Follow up if no improvement - butalbital-acetaminophen-caffeine (FIORICET) 50-325-40 MG tablet; Take 1-2 tablets by mouth every 6 (six) hours as needed for headache.  Dispense: 20 tablet; Refill: 0  2. Restless leg syndrome Begin requip 0.25 mg 2-3 hours before bed Can increase after one week if needed - rOPINIRole (REQUIP) 0.25 MG tablet; Take 1 tablet (0.25 mg  total) by mouth at bedtime.  Dispense: 90 tablet; Refill: 0  3. Idiopathic insomnia Stop Ambien Samples of Belsomra 10 mg and 15 mg - call for Rx if helpful   Partially dictated using Animal nutritionist. Any errors are unintentional.  Bari Edward, MD [facility]  12/04/2019

## 2019-12-04 NOTE — Patient Instructions (Addendum)
Fioricet 1 every 8 hours as needed for headache.  Supplements for restless leg and muscle cramps: Try Magnesium 400 mg per day. Iron 324 mg daily.  Requip 2-3 hours before bedtime for restless leg.  Try Belsomra - 10 mg then 15 mg - take just before going to bed (same as Ambien)   DO not start either Belsomra or Requip until you have resolved the headache and are no longer taking Fioricet.

## 2019-12-05 ENCOUNTER — Ambulatory Visit: Payer: Managed Care, Other (non HMO) | Admitting: Internal Medicine

## 2019-12-11 ENCOUNTER — Other Ambulatory Visit: Payer: Self-pay | Admitting: Internal Medicine

## 2019-12-11 ENCOUNTER — Encounter: Payer: Self-pay | Admitting: Internal Medicine

## 2019-12-11 DIAGNOSIS — F5101 Primary insomnia: Secondary | ICD-10-CM

## 2019-12-11 MED ORDER — BELSOMRA 15 MG PO TABS: 1.0000 | ORAL_TABLET | Freq: Every evening | ORAL | 2 refills | Status: DC | PRN

## 2019-12-14 ENCOUNTER — Ambulatory Visit: Payer: Self-pay | Admitting: Internal Medicine

## 2019-12-17 ENCOUNTER — Telehealth: Payer: Self-pay

## 2019-12-17 NOTE — Telephone Encounter (Signed)
Called pt left VM that Belsomra is not covered by her insurance. Wanted to know which alternative she wanted to take [place] or Sonata. Pts name was stated on VM.  Will route result note to [facility] Nurse Triage for follow up when patient returns call to clinic.    KP

## 2019-12-19 ENCOUNTER — Other Ambulatory Visit: Payer: Self-pay | Admitting: Internal Medicine

## 2019-12-19 DIAGNOSIS — F5101 Primary insomnia: Secondary | ICD-10-CM

## 2019-12-19 MED ORDER — ZALEPLON 10 MG PO CAPS: 10.0000 mg | ORAL_CAPSULE | Freq: Every evening | ORAL | 0 refills | Status: DC | PRN

## 2019-12-23 ENCOUNTER — Encounter: Payer: Self-pay | Admitting: Internal Medicine

## 2019-12-24 ENCOUNTER — Other Ambulatory Visit: Payer: Self-pay | Admitting: Internal Medicine

## 2019-12-24 DIAGNOSIS — F5101 Primary insomnia: Secondary | ICD-10-CM

## 2019-12-24 MED ORDER — ZOLPIDEM TARTRATE 10 MG PO TABS: 10.0000 mg | ORAL_TABLET | Freq: Every evening | ORAL | 5 refills | Status: DC | PRN

## 2019-12-25 ENCOUNTER — Other Ambulatory Visit: Payer: Self-pay

## 2019-12-25 ENCOUNTER — Encounter: Payer: Self-pay | Admitting: Internal Medicine

## 2019-12-25 MED ORDER — FLUCONAZOLE 150 MG PO TABS
150.0000 mg | ORAL_TABLET | Freq: Once | ORAL | 0 refills | Status: AC
Start: 2019-12-25 — End: 2019-12-25

## 2019-12-29 ENCOUNTER — Other Ambulatory Visit: Payer: Self-pay | Admitting: Internal Medicine

## 2019-12-29 DIAGNOSIS — G44209 Tension-type headache, unspecified, not intractable: Secondary | ICD-10-CM

## 2019-12-29 NOTE — Telephone Encounter (Signed)
Requested medication (s) are due for refill today: yes  Requested medication (s) are on the active medication list: yes  Last refill:  12/04/19  Future visit scheduled: no  Notes to clinic:  med not delegated to NT to RF    Requested Prescriptions  Pending Prescriptions Disp Refills   butalbital-acetaminophen-caffeine (FIORICET) 50-325-40 MG tablet [Pharmacy Med Name: BUTALBITAL/ACETAMINOPHEN/CAFF TABS] 20 tablet     Sig: TAKE 1 TO 2 TABLETS BY MOUTH EVERY 6 HOURS AS NEEDED FOR HEADACHE      Not Delegated - Analgesics:  Non-Opioid Analgesic Combinations Failed - 12/29/2019  1:34 PM      Failed - This refill cannot be delegated      Passed - Valid encounter within last 12 months    Recent Outpatient Visits           3 weeks ago Acute non intractable tension-type headache   [facility] [name], MD   7 months ago B12 deficiency   [facility] [name], MD   8 months ago Acute recurrent maxillary sinusitis   [facility] [name], [facility]   11 months ago Uterine prolapse   [facility] [name], MD   1 year ago Annual physical exam   [facility] [name], MD

## 2019-12-31 NOTE — Telephone Encounter (Signed)
Please Advise. Last office visit 12/04/2019. ° °KP

## 2020-01-01 ENCOUNTER — Encounter: Payer: Self-pay | Admitting: Internal Medicine

## 2020-01-21 ENCOUNTER — Other Ambulatory Visit: Payer: Self-pay

## 2020-01-21 MED ORDER — CYANOCOBALAMIN 1000 MCG/ML IJ SOLN: 1000.0000 ug | INTRAMUSCULAR | 5 refills | Status: DC

## 2020-01-21 MED ORDER — OMEPRAZOLE 20 MG PO CPDR: 20.0000 mg | DELAYED_RELEASE_CAPSULE | Freq: Every day | ORAL | 1 refills | Status: DC

## 2020-01-31 ENCOUNTER — Other Ambulatory Visit: Payer: Self-pay | Admitting: Internal Medicine

## 2020-01-31 DIAGNOSIS — G44209 Tension-type headache, unspecified, not intractable: Secondary | ICD-10-CM

## 2020-01-31 NOTE — Telephone Encounter (Signed)
Requested medication (s) are due for refill today -yes  Requested medication (s) are on the active medication list -yes  Future visit scheduled -no  Last refill: 01/01/20  Notes to clinic: Request for non delegated Rx  Requested Prescriptions  Pending Prescriptions Disp Refills   butalbital-acetaminophen-caffeine (FIORICET) 50-325-40 MG tablet [Pharmacy Med Name: BUTALBITAL/ACETAMINOPHEN/CAFF TABS] 20 tablet     Sig: TAKE 1 TO 2 TABLETS FOR PAIN EVERY 6 HOURS AS NEEDED FOR HEADACHE      Not Delegated - Analgesics:  Non-Opioid Analgesic Combinations Failed - 01/31/2020 10:44 AM      Failed - This refill cannot be delegated      Passed - Valid encounter within last 12 months    Recent Outpatient Visits           1 month ago Acute non intractable tension-type headache   [facility] [name], MD   8 months ago B12 deficiency   [facility] [name], MD   9 months ago Acute recurrent maxillary sinusitis   [facility] [name], MD   1 year ago Uterine prolapse   [facility] [name], MD   1 year ago Annual physical exam   [facility] [name], MD                  Requested Prescriptions  Pending Prescriptions Disp Refills   butalbital-acetaminophen-caffeine (FIORICET) 50-325-40 MG tablet [Pharmacy Med Name: BUTALBITAL/ACETAMINOPHEN/CAFF TABS] 20 tablet     Sig: TAKE 1 TO 2 TABLETS FOR PAIN EVERY 6 HOURS AS NEEDED FOR HEADACHE      Not Delegated - Analgesics:  Non-Opioid Analgesic Combinations Failed - 01/31/2020 10:44 AM      Failed - This refill cannot be delegated      Passed - Valid encounter within last 12 months    Recent Outpatient Visits           1 month ago Acute non intractable tension-type headache   [facility] [name], MD   8 months ago B12 deficiency   [facility] [name], MD   9 months ago Acute recurrent maxillary  sinusitis   [facility] [name], MD   1 year ago Uterine prolapse   [facility] [name], MD   1 year ago Annual physical exam   [facility] [name], MD

## 2020-01-31 NOTE — Telephone Encounter (Signed)
Please Advise. Last office visit 12/04/2019. ° °KP

## 2020-02-05 ENCOUNTER — Other Ambulatory Visit: Payer: Self-pay | Admitting: Internal Medicine

## 2020-02-05 ENCOUNTER — Encounter: Payer: Self-pay | Admitting: Internal Medicine

## 2020-02-05 DIAGNOSIS — F5101 Primary insomnia: Secondary | ICD-10-CM

## 2020-02-05 NOTE — Telephone Encounter (Signed)
Pt left her bag with meds in it out of town at relatives.  Pt will be back there in 2 wks, however she left her [name] and is requesting an early refill of the zolpidem (AMBIEN) 10 MG tablet  [id number] - [place], [place] - [address] [place] AT [facility] [address] Phone:  [phone]  Fax:  [phone]     Pt states her refill is due in 2 wks anyway. So a 2 week supply or early refill ok.

## 2020-02-05 NOTE — Telephone Encounter (Signed)
Requested medication (s) are due for refill today: yes  Requested medication (s) are on the active medication list: yes  Last refill:  12/19/19 #30  Future visit scheduled: no  Notes to clinic:  Please review for refill. Refill not delegated per protocol.    Requested Prescriptions  Pending Prescriptions Disp Refills   zaleplon (SONATA) 10 MG capsule [Pharmacy Med Name: ZALEPLON 10MG  CAPSULES] 30 capsule     Sig: TAKE 1 CAPSULE(10 MG) BY MOUTH AT BEDTIME AS NEEDED FOR SLEEP      Not Delegated - Psychiatry:  Anxiolytics/Hypnotics Failed - 02/05/2020  8:07 AM      Failed - This refill cannot be delegated      Failed - Urine Drug Screen completed in last 360 days.      Passed - Valid encounter within last 6 months    Recent Outpatient Visits           2 months ago Acute non intractable tension-type headache   [facility] 04/06/2020, MD   8 months ago B12 deficiency   [facility], MD   9 months ago Acute recurrent maxillary sinusitis   [facility], MD   1 year ago Uterine prolapse   [facility], MD   1 year ago Annual physical exam   [facility], MD

## 2020-02-05 NOTE — Telephone Encounter (Signed)
Please Advise. Last office visit 12/04/2019. ° °KP

## 2020-02-06 ENCOUNTER — Encounter: Payer: Self-pay | Admitting: Internal Medicine

## 2020-02-08 ENCOUNTER — Ambulatory Visit: Payer: Self-pay | Admitting: Internal Medicine

## 2020-02-11 ENCOUNTER — Encounter: Payer: Self-pay | Admitting: Internal Medicine

## 2020-02-11 NOTE — Telephone Encounter (Signed)
Please Advise. Pt does not need until Oct.  KP

## 2020-02-17 ENCOUNTER — Encounter: Payer: Self-pay | Admitting: Internal Medicine

## 2020-03-03 ENCOUNTER — Other Ambulatory Visit: Payer: Self-pay | Admitting: Internal Medicine

## 2020-03-03 DIAGNOSIS — G44209 Tension-type headache, unspecified, not intractable: Secondary | ICD-10-CM

## 2020-03-03 NOTE — Telephone Encounter (Signed)
Requested medications are due for refill today?  Yes - This medication refill cannot be delegated.    Requested medications are on active medication list?  Yes  Last Refill:  01/31/2020  # 20 with no refills   Future visit scheduled?   No   Notes to Clinic:  This medication refill cannot be delegated.

## 2020-03-10 ENCOUNTER — Encounter: Payer: Self-pay | Admitting: Internal Medicine

## 2020-04-09 ENCOUNTER — Encounter: Payer: Self-pay | Admitting: Internal Medicine

## 2020-04-09 ENCOUNTER — Other Ambulatory Visit: Payer: Self-pay | Admitting: Internal Medicine

## 2020-04-09 DIAGNOSIS — F5101 Primary insomnia: Secondary | ICD-10-CM

## 2020-04-09 MED ORDER — ZOLPIDEM TARTRATE ER 12.5 MG PO TBCR: 12.5000 mg | EXTENDED_RELEASE_TABLET | Freq: Every day | ORAL | 1 refills | Status: DC

## 2020-05-05 ENCOUNTER — Encounter: Payer: Self-pay | Admitting: Internal Medicine

## 2020-05-19 ENCOUNTER — Other Ambulatory Visit: Payer: Self-pay | Admitting: Internal Medicine

## 2020-05-19 DIAGNOSIS — G44209 Tension-type headache, unspecified, not intractable: Secondary | ICD-10-CM

## 2020-05-19 NOTE — Telephone Encounter (Signed)
Please review. Last office visit 12/14/2019.  KP

## 2020-05-19 NOTE — Telephone Encounter (Signed)
Requested medication (s) are due for refill today:   Provider to determine  Requested medication (s) are on the active medication list:   Yes  Future visit scheduled:   No   Last ordered: 03/04/2020 #20, 0 refills  Non delegated refill   Requested Prescriptions  Pending Prescriptions Disp Refills   butalbital-acetaminophen-caffeine (FIORICET) 50-325-40 MG tablet [Pharmacy Med Name: BUTALBITAL/ACETAMINOPHEN/CAFF TABS] 20 tablet     Sig: TAKE 1 TO 2 TABLETS BY MOUTH EVERY 6 HOURS AS NEEDED FOR PAIN OR HEADACHE      Not Delegated - Analgesics:  Non-Opioid Analgesic Combinations Failed - 05/19/2020 12:31 PM      Failed - This refill cannot be delegated      Passed - Valid encounter within last 12 months    Recent Outpatient Visits           5 months ago Acute non intractable tension-type headache   [facility] [name], MD   11 months ago B12 deficiency   [facility] [name], MD   1 year ago Acute recurrent maxillary sinusitis   [facility] [name], MD   1 year ago Uterine prolapse   [facility] [name], MD   1 year ago Annual physical exam   [facility] [name], MD

## 2020-06-08 ENCOUNTER — Other Ambulatory Visit: Payer: Self-pay | Admitting: Internal Medicine

## 2020-06-08 DIAGNOSIS — F5101 Primary insomnia: Secondary | ICD-10-CM

## 2020-06-09 NOTE — Telephone Encounter (Signed)
Requested medication (s) are due for refill today: yes  Requested medication (s) are on the active medication list: yes  Last refill:  05/05/2020  Future visit scheduled: yes  Notes to clinic:  this refill cannot be delegated    Requested Prescriptions  Pending Prescriptions Disp Refills   zolpidem (AMBIEN CR) 12.5 MG CR tablet [Pharmacy Med Name: ZOLPIDEM ER 12.5MG  TABLETS] 30 tablet     Sig: TAKE 1 TABLET(12.5 MG) BY MOUTH AT BEDTIME      Not Delegated - Psychiatry:  Anxiolytics/Hypnotics Failed - 06/08/2020  7:25 PM      Failed - This refill cannot be delegated      Failed - Urine Drug Screen completed in last 360 days      Failed - Valid encounter within last 6 months    Recent Outpatient Visits           6 months ago Acute non intractable tension-type headache   [facility] [name], MD   1 year ago B12 deficiency   [facility] [name], MD   1 year ago Acute recurrent maxillary sinusitis   [facility] [name], MD   1 year ago Uterine prolapse   [facility] [name], MD   1 year ago Annual physical exam   [facility] [name], MD       Future Appointments             In 1 week Judithann Graves Nyoka Cowden, MD [facility], [facility]

## 2020-06-11 ENCOUNTER — Encounter: Payer: Self-pay | Admitting: Internal Medicine

## 2020-06-13 DIAGNOSIS — Z03818 Encounter for observation for suspected exposure to other biological agents ruled out: Secondary | ICD-10-CM | POA: Diagnosis not present

## 2020-06-20 ENCOUNTER — Ambulatory Visit: Payer: Managed Care, Other (non HMO) | Admitting: Internal Medicine

## 2020-06-30 ENCOUNTER — Encounter: Payer: Self-pay | Admitting: Internal Medicine

## 2020-06-30 ENCOUNTER — Other Ambulatory Visit: Payer: Self-pay | Admitting: Internal Medicine

## 2020-06-30 DIAGNOSIS — F5101 Primary insomnia: Secondary | ICD-10-CM

## 2020-06-30 MED ORDER — ZOLPIDEM TARTRATE 10 MG PO TABS: 10.0000 mg | ORAL_TABLET | Freq: Every evening | ORAL | 3 refills | Status: DC | PRN

## 2020-06-30 NOTE — Telephone Encounter (Signed)
Pt response.  KP

## 2020-07-10 ENCOUNTER — Ambulatory Visit: Payer: Self-pay | Admitting: Internal Medicine

## 2020-07-30 ENCOUNTER — Encounter: Payer: Self-pay | Admitting: Internal Medicine

## 2020-08-12 ENCOUNTER — Encounter: Payer: Self-pay | Admitting: Internal Medicine

## 2020-08-20 ENCOUNTER — Other Ambulatory Visit: Payer: Self-pay | Admitting: Internal Medicine

## 2020-08-20 DIAGNOSIS — G44209 Tension-type headache, unspecified, not intractable: Secondary | ICD-10-CM

## 2020-08-20 NOTE — Telephone Encounter (Signed)
Requested medications are due for refill today.  yes  Requested medications are on the active medications list.  yes  Last refill. 05/19/2020  Future visit scheduled.   yes  Notes to clinic.  Medication not delegated.

## 2020-08-21 NOTE — Telephone Encounter (Signed)
Please review. Last office visit 12/04/2019.  KP

## 2020-08-26 ENCOUNTER — Encounter: Payer: Self-pay | Admitting: Internal Medicine

## 2020-08-26 ENCOUNTER — Telehealth: Payer: BC Managed Care – PPO | Admitting: Physician Assistant

## 2020-08-26 DIAGNOSIS — B9789 Other viral agents as the cause of diseases classified elsewhere: Secondary | ICD-10-CM

## 2020-08-26 DIAGNOSIS — J019 Acute sinusitis, unspecified: Secondary | ICD-10-CM

## 2020-08-26 MED ORDER — IPRATROPIUM BROMIDE 0.03 % NA SOLN: 2.0000 | Freq: Two times a day (BID) | NASAL | 0 refills | Status: DC

## 2020-08-26 NOTE — Progress Notes (Signed)

## 2020-08-26 NOTE — Progress Notes (Signed)
I have spent 5 minutes in review of e-visit questionnaire, review and updating patient chart, medical decision making and response to patient.   Ansh Fauble Cody Aaban Griep, PA-C    

## 2020-08-27 ENCOUNTER — Ambulatory Visit: Payer: Self-pay | Admitting: Internal Medicine

## 2020-10-14 DIAGNOSIS — M542 Cervicalgia: Secondary | ICD-10-CM | POA: Diagnosis not present

## 2020-10-14 DIAGNOSIS — M546 Pain in thoracic spine: Secondary | ICD-10-CM | POA: Diagnosis not present

## 2020-10-14 DIAGNOSIS — M25512 Pain in left shoulder: Secondary | ICD-10-CM | POA: Diagnosis not present

## 2020-10-14 DIAGNOSIS — M9905 Segmental and somatic dysfunction of pelvic region: Secondary | ICD-10-CM | POA: Diagnosis not present

## 2020-10-16 ENCOUNTER — Other Ambulatory Visit: Payer: Self-pay | Admitting: Internal Medicine

## 2020-10-16 ENCOUNTER — Encounter: Payer: Self-pay | Admitting: Internal Medicine

## 2020-10-16 DIAGNOSIS — F5101 Primary insomnia: Secondary | ICD-10-CM

## 2020-10-16 MED ORDER — ZOLPIDEM TARTRATE 10 MG PO TABS: 10.0000 mg | ORAL_TABLET | Freq: Every evening | ORAL | 3 refills | Status: DC | PRN

## 2020-10-24 ENCOUNTER — Other Ambulatory Visit: Payer: Self-pay | Admitting: Internal Medicine

## 2020-10-24 DIAGNOSIS — G44209 Tension-type headache, unspecified, not intractable: Secondary | ICD-10-CM

## 2020-10-24 NOTE — Telephone Encounter (Signed)
Requested medication (s) are due for refill today: yes  Requested medication (s) are on the active medication list: yes  Last refill:  08/21/20  Future visit scheduled: yes  Notes to clinic:  not delegated   Requested Prescriptions  Pending Prescriptions Disp Refills   butalbital-acetaminophen-caffeine (FIORICET) 50-325-40 MG tablet [Pharmacy Med Name: BUTALBITAL/ACETAMINOPHEN/CAFF TABS] 20 tablet     Sig: TAKE 1 TO 2 TABLETS BY MOUTH EVERY 6 HOURS AS NEEDED FOR PAIN OR HEADACHE      Not Delegated - Analgesics:  Non-Opioid Analgesic Combinations Failed - 10/24/2020  9:09 AM      Failed - This refill cannot be delegated      Passed - Valid encounter within last 12 months    Recent Outpatient Visits           10 months ago Acute non intractable tension-type headache   [facility] [name], MD   1 year ago B12 deficiency   [facility] [name], MD   1 year ago Acute recurrent maxillary sinusitis   [facility] [name], MD   1 year ago Uterine prolapse   [facility] [name], MD   2 years ago Annual physical exam   [facility] [name], MD       Future Appointments             In 2 weeks Judithann Graves Nyoka Cowden, MD [facility], [facility]

## 2020-10-24 NOTE — Telephone Encounter (Signed)
Please review. Last office visit 11/2019. Pt has an appt 11/10/2020.  KP

## 2020-11-10 ENCOUNTER — Other Ambulatory Visit: Payer: Self-pay

## 2020-11-10 ENCOUNTER — Encounter: Payer: Self-pay | Admitting: Internal Medicine

## 2020-11-10 ENCOUNTER — Ambulatory Visit (INDEPENDENT_AMBULATORY_CARE_PROVIDER_SITE_OTHER): Payer: BC Managed Care – PPO | Admitting: Internal Medicine

## 2020-11-10 VITALS — BP 106/82 | HR 73 | Temp 98.1°F | Ht 64.0 in | Wt 230.0 lb

## 2020-11-10 DIAGNOSIS — Z1159 Encounter for screening for other viral diseases: Secondary | ICD-10-CM

## 2020-11-10 DIAGNOSIS — Z1231 Encounter for screening mammogram for malignant neoplasm of breast: Secondary | ICD-10-CM | POA: Diagnosis not present

## 2020-11-10 DIAGNOSIS — Z0000 Encounter for general adult medical examination without abnormal findings: Secondary | ICD-10-CM

## 2020-11-10 DIAGNOSIS — E538 Deficiency of other specified B group vitamins: Secondary | ICD-10-CM

## 2020-11-10 DIAGNOSIS — E7800 Pure hypercholesterolemia, unspecified: Secondary | ICD-10-CM | POA: Diagnosis not present

## 2020-11-10 DIAGNOSIS — Z1211 Encounter for screening for malignant neoplasm of colon: Secondary | ICD-10-CM

## 2020-11-10 MED ORDER — CYANOCOBALAMIN 1000 MCG/ML IJ SOLN
1000.0000 ug | Freq: Once | INTRAMUSCULAR | Status: AC
  Administered 2020-11-10: 1000 ug via INTRAMUSCULAR

## 2020-11-10 NOTE — Progress Notes (Signed)
Date:  11/10/2020   Name:  Christina Carlson   DOB:  1975-07-19   MRN:  [id number]   Chief Complaint: Annual Exam (Breast exam no pap ) Christina Carlson is a 45 y.o. female who presents today for her Complete Annual Exam. She feels well. She reports exercising none. She reports she is sleeping well. Breast complaints none.  Mammogram: 07/2019 [facility] DEXA: not due Pap smear: 01/2019 neg with co-testing Colonoscopy: due  Immunization History  Administered Date(s) Administered   Hepatitis B 05/31/2010, 08/30/2010, 10/30/2010, 01/14/2017   Hepatitis B, adult 01/14/2017, 03/01/2017   Influenza,inj,Quad PF,6+ Mos 03/16/2016, 01/10/2017, 03/31/2018   Influenza-Unspecified 03/16/2016, 03/20/2019   PFIZER(Purple Top)SARS-COV-2 Vaccination 05/18/2019, 06/15/2019   Tdap 07/02/2008, 05/31/2010    Migraine  This is a recurrent problem. Associated symptoms include insomnia. Pertinent negatives include no abdominal pain, coughing, dizziness, fever, hearing loss, tinnitus or vomiting. Treatments tried: fioricet.  Insomnia Primary symptoms: sleep disturbance, difficulty falling asleep.   The problem occurs nightly. The symptoms are relieved by medication [name]).  Restless leg syndrome - resolved Lab Results  Component Value Date   CREATININE 0.82 10/05/2018   BUN 9 10/05/2018   NA 140 10/05/2018   K 3.9 10/05/2018   CL 106 10/05/2018   CO2 18 (L) 10/05/2018   Lab Results  Component Value Date   CHOL 152 10/05/2018   HDL 45 10/05/2018   LDLCALC 92 10/05/2018   TRIG 74 10/05/2018   CHOLHDL 3.4 10/05/2018   Lab Results  Component Value Date   TSH 2.780 10/05/2018   Lab Results  Component Value Date   HGBA1C 5.3 09/14/2017   Lab Results  Component Value Date   WBC 6.8 10/05/2018   HGB 11.8 10/05/2018   HCT 35.6 10/05/2018   MCV 83 10/05/2018   PLT 339 10/05/2018   Lab Results  Component Value Date   ALT 10 10/05/2018   AST 11 10/05/2018   ALKPHOS 80 10/05/2018    BILITOT 0.5 10/05/2018     Review of Systems  Constitutional:  Negative for chills, fatigue and fever.  HENT:  Negative for congestion, hearing loss, tinnitus, trouble swallowing and voice change.   Eyes:  Negative for visual disturbance.  Respiratory:  Negative for cough, chest tightness, shortness of breath and wheezing.   Cardiovascular:  Negative for chest pain, palpitations and leg swelling.  Gastrointestinal:  Negative for abdominal pain, constipation, diarrhea and vomiting.  Endocrine: Negative for polydipsia and polyuria.  Genitourinary:  Negative for dysuria, frequency, genital sores, vaginal bleeding and vaginal discharge.  Musculoskeletal:  Negative for arthralgias, gait problem and joint swelling.  Skin:  Negative for color change and rash.  Neurological:  Negative for dizziness, tremors, light-headedness and headaches.  Hematological:  Negative for adenopathy. Does not bruise/bleed easily.  Psychiatric/Behavioral:  Positive for sleep disturbance. Negative for dysphoric mood. The patient has insomnia. The patient is not nervous/anxious.    Patient Active Problem List   Diagnosis Date Noted   Restless leg syndrome 12/04/2019   Atypical squamous cells of undetermined significance on cytologic smear of cervix (ASC-US) 06/27/2015   Vitamin B12 nutritional deficiency 05/07/2015   LBP (low back pain) 09/16/2014   Breast screening 09/16/2014   Bariatric surgery status 09/16/2014   Gastro-esophageal reflux disease without esophagitis 09/16/2014   H/O motion sickness 09/16/2014   Migraine without aura and responsive to treatment 09/16/2014   Idiopathic insomnia 09/16/2014    Allergies  Allergen Reactions   Sulfa Antibiotics  Oxycodone Itching   Tramadol Palpitations    Past Surgical History:  Procedure Laterality Date   BREAST BIOPSY Right 2001   neg   INGUINAL HERNIA REPAIR  1981   ROUX-EN-Y GASTRIC BYPASS  2007   TONSILLECTOMY      Social History   Tobacco  Use   Smoking status: Never   Smokeless tobacco: Never  Vaping Use   Vaping Use: Never used  Substance Use Topics   Alcohol use: No    Alcohol/week: 0.0 standard drinks   Drug use: No     Medication list has been reviewed and updated.  No outpatient medications have been marked as taking for the 11/10/20 encounter (Office Visit) with [name], MD.    [facility] 2/9 Scores 11/10/2020 12/04/2019 05/02/2019 01/31/2019  PHQ - 2 Score 0 0 0 0  PHQ- 9 Score 4 1 - 0    GAD 7 : Generalized Anxiety Score 11/10/2020 12/04/2019  Nervous, Anxious, on Edge 0 0  Control/stop worrying 0 0  Worry too much - different things 0 0  Trouble relaxing 1 0  Restless 0 0  Easily annoyed or irritable 0 0  Afraid - awful might happen 0 0  Total GAD 7 Score 1 0  Anxiety Difficulty - Not difficult at all    BP Readings from Last 3 Encounters:  11/10/20 106/82  12/04/19 116/76  02/14/19 109/75    Physical Exam Vitals and nursing note reviewed.  Constitutional:      General: She is not in acute distress.    Appearance: She is well-developed.  HENT:     Head: Normocephalic and atraumatic.     Right Ear: Tympanic membrane and ear canal normal.     Left Ear: Tympanic membrane and ear canal normal.     Nose:     Right Sinus: No maxillary sinus tenderness.     Left Sinus: No maxillary sinus tenderness.  Eyes:     General: No scleral icterus.       Right eye: No discharge.        Left eye: No discharge.     Conjunctiva/sclera: Conjunctivae normal.  Neck:     Thyroid: No thyromegaly.     Vascular: No carotid bruit.  Cardiovascular:     Rate and Rhythm: Normal rate and regular rhythm.     Pulses: Normal pulses.     Heart sounds: Normal heart sounds.  Pulmonary:     Effort: Pulmonary effort is normal. No respiratory distress.     Breath sounds: No wheezing.  Chest:  Breasts:    Right: No mass, nipple discharge, skin change or tenderness.     Left: No mass, nipple discharge, skin change or  tenderness.  Abdominal:     General: Bowel sounds are normal.     Palpations: Abdomen is soft.     Tenderness: There is no abdominal tenderness.  Musculoskeletal:     Cervical back: Normal range of motion. No erythema.     Right lower leg: No edema.     Left lower leg: No edema.  Lymphadenopathy:     Cervical: No cervical adenopathy.  Skin:    General: Skin is warm and dry.     Findings: No rash.  Neurological:     Mental Status: She is alert and oriented to person, place, and time.     Cranial Nerves: No cranial nerve deficit.     Sensory: No sensory deficit.     Deep Tendon Reflexes:  Reflexes are normal and symmetric.  Psychiatric:        Attention and Perception: Attention normal.        Mood and Affect: Mood normal.    Wt Readings from Last 3 Encounters:  11/10/20 230 lb (104.3 kg)  12/04/19 221 lb (100.2 kg)  05/02/19 202 lb (91.6 kg)    BP 106/82   Pulse 73   Temp 98.1 F (36.7 C) (Oral)   Ht 5\' 4"  (1.626 m)   Wt 230 lb (104.3 kg)   LMP 02/14/2019 (Exact Date)   SpO2 98%   BMI 39.48 kg/m   Assessment and Plan: 1. Annual physical exam Exam is normal except for weight. Encourage regular exercise and appropriate dietary changes. - CBC with Differential/Platelet - Comprehensive metabolic panel - Lipid panel - TSH  2. Encounter for screening mammogram for breast cancer Pt to schedule at [facility]  3. Colon cancer screening Family hx of colon cancer in father - refer for screening now - Ambulatory referral to Gastroenterology  4. Vitamin B12 nutritional deficiency Continue IM supplements every 2 months - Vitamin B12  5. Need for hepatitis C screening test - Hepatitis C antibody   Partially dictated using [facility]. Any errors are unintentional.  Animal nutritionist, MD [facility]  11/10/2020

## 2020-11-11 LAB — CBC WITH DIFFERENTIAL/PLATELET
Basophils Absolute: 0 10*3/uL (ref 0.0–0.2)
Basos: 1 %
EOS (ABSOLUTE): 0.1 10*3/uL (ref 0.0–0.4)
Eos: 2 %
Hematocrit: 35 % (ref 34.0–46.6)
Hemoglobin: 11.2 g/dL (ref 11.1–15.9)
Immature Grans (Abs): 0 10*3/uL (ref 0.0–0.1)
Immature Granulocytes: 0 %
Lymphocytes Absolute: 1.9 10*3/uL (ref 0.7–3.1)
Lymphs: 28 %
MCH: 27.4 pg (ref 26.6–33.0)
MCHC: 32 g/dL (ref 31.5–35.7)
MCV: 86 fL (ref 79–97)
Monocytes Absolute: 0.5 10*3/uL (ref 0.1–0.9)
Monocytes: 7 %
Neutrophils Absolute: 4.1 10*3/uL (ref 1.4–7.0)
Neutrophils: 62 %
Platelets: 343 10*3/uL (ref 150–450)
RBC: 4.09 x10E6/uL (ref 3.77–5.28)
RDW: 13.9 % (ref 11.7–15.4)
WBC: 6.6 10*3/uL (ref 3.4–10.8)

## 2020-11-11 LAB — COMPREHENSIVE METABOLIC PANEL
ALT: 6 IU/L (ref 0–32)
AST: 9 IU/L (ref 0–40)
Albumin/Globulin Ratio: 1.7 (ref 1.2–2.2)
Albumin: 3.8 g/dL (ref 3.8–4.8)
Alkaline Phosphatase: 100 IU/L (ref 44–121)
BUN/Creatinine Ratio: 11 (ref 9–23)
BUN: 9 mg/dL (ref 6–24)
Bilirubin Total: <0.2 mg/dL (ref 0.0–1.2)
CO2: 20 mmol/L (ref 20–29)
Calcium: 9 mg/dL (ref 8.7–10.2)
Chloride: 105 mmol/L (ref 96–106)
Creatinine, Ser: 0.79 mg/dL (ref 0.57–1.00)
Globulin, Total: 2.3 g/dL (ref 1.5–4.5)
Glucose: 73 mg/dL (ref 65–99)
Potassium: 4.4 mmol/L (ref 3.5–5.2)
Sodium: 138 mmol/L (ref 134–144)
Total Protein: 6.1 g/dL (ref 6.0–8.5)
eGFR: 93 mL/min/{1.73_m2} (ref >59)

## 2020-11-11 LAB — LIPID PANEL
Chol/HDL Ratio: 3.6 ratio (ref 0.0–4.4)
Cholesterol, Total: 197 mg/dL (ref 100–199)
HDL: 54 mg/dL (ref >39)
LDL Chol Calc (NIH): 125 mg/dL — ABNORMAL HIGH (ref 0–99)
Triglycerides: 98 mg/dL (ref 0–149)
VLDL Cholesterol Cal: 18 mg/dL (ref 5–40)

## 2020-11-11 LAB — TSH: TSH: 2.51 u[IU]/mL (ref 0.450–4.500)

## 2020-11-11 LAB — VITAMIN B12: Vitamin B-12: >2000 pg/mL — ABNORMAL HIGH (ref 232–1245)

## 2020-11-11 LAB — HEPATITIS C ANTIBODY: Hep C Virus Ab: <0.1 s/co ratio (ref 0.0–0.9)

## 2020-11-13 ENCOUNTER — Other Ambulatory Visit: Payer: Self-pay | Admitting: Internal Medicine

## 2020-11-13 DIAGNOSIS — G44209 Tension-type headache, unspecified, not intractable: Secondary | ICD-10-CM

## 2020-11-13 NOTE — Telephone Encounter (Signed)
Requested medication (s) are due for refill today: yes   Requested medication (s) are on the active medication list: yes   Last refill: 10/24/2020  Future visit scheduled: yes   Notes to clinic: this refill cannot be delegated    Requested Prescriptions  Pending Prescriptions Disp Refills   butalbital-acetaminophen-caffeine (FIORICET) 50-325-40 MG tablet [Pharmacy Med Name: BUTALBITAL/ACETAMINOPHEN/CAFF TABS] 20 tablet     Sig: TAKE 1 TO 2 TABLETS BY MOUTH EVERY 6 HOURS AS NEEDED FOR PAIN OR HEADACHE      Not Delegated - Analgesics:  Non-Opioid Analgesic Combinations Failed - 11/13/2020  7:34 AM      Failed - This refill cannot be delegated      Passed - Valid encounter within last 12 months    Recent Outpatient Visits           3 days ago Annual physical exam   [facility] [name], MD   11 months ago Acute non intractable tension-type headache   [facility] [name], MD   1 year ago B12 deficiency   [facility] [name], MD   1 year ago Acute recurrent maxillary sinusitis   [facility] [name], MD   1 year ago Uterine prolapse   [facility] [name], MD       Future Appointments             In 1 month  [facility]   In 12 months Judithann Graves, Nyoka Cowden, MD [facility], [facility]

## 2020-11-13 NOTE — Telephone Encounter (Signed)
Please review. Last office visit 11/12/2020.  KP

## 2020-11-18 ENCOUNTER — Other Ambulatory Visit: Payer: Self-pay | Admitting: Internal Medicine

## 2020-11-18 DIAGNOSIS — G44209 Tension-type headache, unspecified, not intractable: Secondary | ICD-10-CM

## 2020-11-18 MED ORDER — BUTALBITAL-APAP-CAFFEINE 50-325-40 MG PO TABS
1.0000 | ORAL_TABLET | Freq: Every day | ORAL | 0 refills | Status: DC | PRN
Start: 2020-11-18 — End: 2020-12-31

## 2020-12-11 ENCOUNTER — Other Ambulatory Visit: Payer: Self-pay | Admitting: Internal Medicine

## 2020-12-11 DIAGNOSIS — G44209 Tension-type headache, unspecified, not intractable: Secondary | ICD-10-CM

## 2020-12-12 ENCOUNTER — Other Ambulatory Visit: Payer: Self-pay | Admitting: Internal Medicine

## 2020-12-12 DIAGNOSIS — G44209 Tension-type headache, unspecified, not intractable: Secondary | ICD-10-CM

## 2020-12-12 NOTE — Telephone Encounter (Signed)
Called pt could not leave VM. VM was full. Please read pt message from Dr. Judithann Graves if she calls back.  KP

## 2020-12-12 NOTE — Telephone Encounter (Signed)
Please review. Pt wants a refill on meds due to having more headaches in the summer due to heat.  KP

## 2020-12-12 NOTE — Telephone Encounter (Signed)
Please review. Last office visit 11/18/2020. ° °KP

## 2020-12-12 NOTE — Telephone Encounter (Signed)
Requested medication (s) are due for refill today: yes  Requested medication (s) are on the active medication list: yes  Last refill:  11/18/2020  Future visit scheduled: yes  Notes to clinic:  This refill cannot be delegated   Requested Prescriptions  Pending Prescriptions Disp Refills   butalbital-acetaminophen-caffeine (FIORICET) 50-325-40 MG tablet [Pharmacy Med Name: BUTALBITAL/ACETAMINOPHEN/CAFF TABS] 20 tablet     Sig: Take 1 tablet by mouth daily as needed for headache.      Not Delegated - Analgesics:  Non-Opioid Analgesic Combinations Failed - 12/11/2020  7:19 PM      Failed - This refill cannot be delegated      Passed - Valid encounter within last 12 months    Recent Outpatient Visits           1 month ago Annual physical exam   [facility] [name], MD   1 year ago Acute non intractable tension-type headache   [facility] [name], MD   1 year ago B12 deficiency   [facility] [name], MD   1 year ago Acute recurrent maxillary sinusitis   [facility] [name], MD   1 year ago Uterine prolapse   [facility] [name], MD       Future Appointments             In 3 days  [facility]    In 11 months Judithann Graves, Nyoka Cowden, MD [facility], [facility]

## 2020-12-15 ENCOUNTER — Other Ambulatory Visit: Payer: Self-pay

## 2020-12-15 ENCOUNTER — Telehealth (INDEPENDENT_AMBULATORY_CARE_PROVIDER_SITE_OTHER): Payer: Self-pay | Admitting: Gastroenterology

## 2020-12-15 DIAGNOSIS — Z1211 Encounter for screening for malignant neoplasm of colon: Secondary | ICD-10-CM

## 2020-12-15 MED ORDER — NA SULFATE-K SULFATE-MG SULF 17.5-3.13-1.6 GM/177ML PO SOLN: 1.0000 | Freq: Once | ORAL | 0 refills | Status: AC

## 2020-12-15 NOTE — Progress Notes (Signed)
Gastroenterology Pre-Procedure Review  Request Date: 01/16/21 Requesting Physician: Dr. Servando Snare  PATIENT REVIEW QUESTIONS: The patient responded to the following health history questions as indicated:    1. Are you having any GI issues? no 2. Do you have a personal history of Polyps? no 3. Do you have a family history of Colon Cancer or Polyps? yes (Father colon cancer) 4. Diabetes Mellitus? no 5. Joint replacements in the past 12 months?no 6. Major health problems in the past 3 months?no 7. Any artificial heart valves, MVP, or defibrillator?no    MEDICATIONS & ALLERGIES:    Patient reports the following regarding taking any anticoagulation/antiplatelet therapy:   Plavix, Coumadin, Eliquis, Xarelto, Lovenox, Pradaxa, Brilinta, or Effient? no Aspirin? no  Patient confirms/reports the following medications:  Current Outpatient Medications  Medication Sig Dispense Refill   butalbital-acetaminophen-caffeine (FIORICET) 50-325-40 MG tablet Take 1 tablet by mouth daily as needed for headache. 20 tablet 0   cyanocobalamin (,VITAMIN B-12,) 1000 MCG/ML injection Inject 1 mL (1,000 mcg total) into the muscle every 30 (thirty) days. 1 mL 5   ipratropium (ATROVENT) 0.03 % nasal spray Place 2 sprays into both nostrils every 12 (twelve) hours. 30 mL 0   omeprazole (PRILOSEC) 20 MG capsule Take 1 capsule (20 mg total) by mouth daily. 90 capsule 1   zolpidem (AMBIEN) 10 MG tablet Take 1 tablet (10 mg total) by mouth at bedtime as needed for sleep. 30 tablet 3   No current facility-administered medications for this visit.    Patient confirms/reports the following allergies:  Allergies  Allergen Reactions   Sulfa Antibiotics    Oxycodone Itching   Tramadol Palpitations    No orders of the defined types were placed in this encounter.   AUTHORIZATION INFORMATION Primary Insurance: 1D#: Group #:  Secondary Insurance: 1D#: Group #:  SCHEDULE INFORMATION: Date: 01/16/21 Time: Location:  [facility]

## 2020-12-19 DIAGNOSIS — Z1231 Encounter for screening mammogram for malignant neoplasm of breast: Secondary | ICD-10-CM | POA: Diagnosis not present

## 2020-12-31 ENCOUNTER — Other Ambulatory Visit: Payer: Self-pay | Admitting: Internal Medicine

## 2020-12-31 ENCOUNTER — Other Ambulatory Visit: Payer: Self-pay

## 2020-12-31 DIAGNOSIS — G44209 Tension-type headache, unspecified, not intractable: Secondary | ICD-10-CM

## 2020-12-31 MED ORDER — PEG 3350-KCL-NA BICARB-NACL 420 G PO SOLR: ORAL | 0 refills | Status: DC

## 2020-12-31 MED ORDER — BUTALBITAL-APAP-CAFFEINE 50-325-40 MG PO TABS: 1.0000 | ORAL_TABLET | Freq: Every day | ORAL | 1 refills | Status: DC | PRN

## 2021-01-12 ENCOUNTER — Encounter: Payer: Self-pay | Admitting: Gastroenterology

## 2021-01-13 ENCOUNTER — Other Ambulatory Visit: Payer: Self-pay | Admitting: Internal Medicine

## 2021-01-13 DIAGNOSIS — F5101 Primary insomnia: Secondary | ICD-10-CM

## 2021-01-13 NOTE — Telephone Encounter (Signed)
Requested medication (s) are due for refill today: no   Requested medication (s) are on the active medication list:  yes   Last refill:  09/25/2020  Future visit scheduled:  yes   Notes to clinic:  this refill cannot be delegated    Requested Prescriptions  Pending Prescriptions Disp Refills   zolpidem (AMBIEN) 10 MG tablet [Pharmacy Med Name: ZOLPIDEM 10MG  TABLETS] 30 tablet     Sig: TAKE 1 TABLET(10 MG) BY MOUTH AT BEDTIME AS NEEDED FOR SLEEP     Not Delegated - Psychiatry:  Anxiolytics/Hypnotics Failed - 01/13/2021  1:58 PM      Failed - This refill cannot be delegated      Failed - Urine Drug Screen completed in last 360 days      Passed - Valid encounter within last 6 months    Recent Outpatient Visits           2 months ago Annual physical exam   [facility], MD   1 year ago Acute non intractable tension-type headache   [facility] [name], MD   1 year ago B12 deficiency   [facility], MD   1 year ago Acute recurrent maxillary sinusitis   [facility], MD   1 year ago Uterine prolapse   [facility], MD       Future Appointments             In 10 months [name], MD [facility], [facility]             zolpidem (AMBIEN CR) 12.5 MG CR tablet [Pharmacy Med Name: ZOLPIDEM ER 12.5MG  TABLETS] 30 tablet     Sig: TAKE 1 TABLET(12.5 MG) BY MOUTH AT BEDTIME     Not Delegated - Psychiatry:  Anxiolytics/Hypnotics Failed - 01/13/2021  1:58 PM      Failed - This refill cannot be delegated      Failed - Urine Drug Screen completed in last 360 days      Passed - Valid encounter within last 6 months    Recent Outpatient Visits           2 months ago Annual physical exam   [facility], MD   1 year ago Acute non intractable tension-type headache   [facility] [name], MD   1 year  ago B12 deficiency   [facility], MD   1 year ago Acute recurrent maxillary sinusitis   [facility], MD   1 year ago Uterine prolapse   [facility], MD       Future Appointments             In 10 months [name] Judithann Graves, MD [facility], [facility]

## 2021-01-16 ENCOUNTER — Encounter (INDEPENDENT_AMBULATORY_CARE_PROVIDER_SITE_OTHER): Payer: Self-pay

## 2021-01-16 ENCOUNTER — Other Ambulatory Visit: Payer: Self-pay

## 2021-01-16 ENCOUNTER — Ambulatory Visit: Payer: BC Managed Care – PPO | Admitting: Anesthesiology

## 2021-01-16 ENCOUNTER — Ambulatory Visit
Admission: RE | Admit: 2021-01-16 | Discharge: 2021-01-16 | Disposition: A | Discharge status: Home / Self Care | Payer: BC Managed Care – PPO | Attending: Gastroenterology | Admitting: Gastroenterology

## 2021-01-16 ENCOUNTER — Encounter
Admission: RE | Disposition: A | Discharge status: Home / Self Care | Payer: Self-pay | Source: Home / Self Care | Attending: Gastroenterology

## 2021-01-16 ENCOUNTER — Encounter: Payer: Self-pay | Admitting: Gastroenterology

## 2021-01-16 DIAGNOSIS — Z1211 Encounter for screening for malignant neoplasm of colon: Secondary | ICD-10-CM | POA: Diagnosis not present

## 2021-01-16 DIAGNOSIS — K640 First degree hemorrhoids: Secondary | ICD-10-CM | POA: Insufficient documentation

## 2021-01-16 DIAGNOSIS — Z882 Allergy status to sulfonamides status: Secondary | ICD-10-CM | POA: Diagnosis not present

## 2021-01-16 DIAGNOSIS — Z9884 Bariatric surgery status: Secondary | ICD-10-CM | POA: Diagnosis not present

## 2021-01-16 DIAGNOSIS — K635 Polyp of colon: Secondary | ICD-10-CM

## 2021-01-16 DIAGNOSIS — Z885 Allergy status to narcotic agent status: Secondary | ICD-10-CM | POA: Insufficient documentation

## 2021-01-16 DIAGNOSIS — Z79899 Other long term (current) drug therapy: Secondary | ICD-10-CM | POA: Diagnosis not present

## 2021-01-16 HISTORY — PX: COLONOSCOPY WITH PROPOFOL: SHX5780

## 2021-01-16 HISTORY — PX: POLYPECTOMY: SHX5525

## 2021-01-16 HISTORY — DX: Presence of spectacles and contact lenses: Z97.3

## 2021-01-16 SURGERY — COLONOSCOPY WITH PROPOFOL
Anesthesia: General | Site: Rectum

## 2021-01-16 MED ORDER — LIDOCAINE HCL (CARDIAC) PF 100 MG/5ML IV SOSY
PREFILLED_SYRINGE | INTRAVENOUS | Status: DC | PRN
  Administered 2021-01-16: 30 mg via INTRAVENOUS

## 2021-01-16 MED ORDER — FENTANYL CITRATE PF 50 MCG/ML IJ SOSY: 25.0000 ug | PREFILLED_SYRINGE | INTRAMUSCULAR | Status: DC | PRN

## 2021-01-16 MED ORDER — PROPOFOL 10 MG/ML IV BOLUS
INTRAVENOUS | Status: DC | PRN
  Administered 2021-01-16 (×3): 50 mg via INTRAVENOUS
  Administered 2021-01-16 (×3): 40 mg via INTRAVENOUS
  Administered 2021-01-16: 100 mg via INTRAVENOUS
  Administered 2021-01-16: 40 mg via INTRAVENOUS

## 2021-01-16 MED ORDER — PROMETHAZINE HCL 25 MG/ML IJ SOLN: 6.2500 mg | INTRAMUSCULAR | Status: DC | PRN

## 2021-01-16 MED ORDER — SODIUM CHLORIDE 0.9 % IV SOLN: INTRAVENOUS | Status: DC

## 2021-01-16 MED ORDER — LACTATED RINGERS IV SOLN: INTRAVENOUS | Status: DC

## 2021-01-16 MED ORDER — STERILE WATER FOR IRRIGATION IR SOLN
Status: DC | PRN
  Administered 2021-01-16: .05 mL

## 2021-01-16 MED ORDER — MEPERIDINE HCL 25 MG/ML IJ SOLN: 6.2500 mg | INTRAMUSCULAR | Status: DC | PRN

## 2021-01-16 SURGICAL SUPPLY — 8 items
GOWN CVR UNV OPN BCK APRN NK (MISCELLANEOUS) ×4 IMPLANT
GOWN ISOL THUMB LOOP REG UNIV (MISCELLANEOUS) ×6
KIT PRC NS LF DISP ENDO (KITS) ×2 IMPLANT
KIT PROCEDURE OLYMPUS (KITS) ×3
MANIFOLD NEPTUNE II (INSTRUMENTS) ×3 IMPLANT
SNARE COLD EXACTO (MISCELLANEOUS) ×3 IMPLANT
TRAP ETRAP POLY (MISCELLANEOUS) ×3 IMPLANT
WATER STERILE IRR 250ML POUR (IV SOLUTION) ×3 IMPLANT

## 2021-01-16 NOTE — H&P (Signed)
Christina Minium, MD [facility] [address] [place], [place] [address] Phone: [phone] Fax : [phone]  Primary Care Physician:  [name], MD Primary Gastroenterologist:  Dr. Servando Snare  Pre-Procedure History & Physical: HPI:  Christina Carlson is a 45 y.o. female is here for a screening colonoscopy.   Past Medical History:  Diagnosis Date   Family history of breast cancer    9/20 cancer genetic testing letter sent   History of latent tuberculosis 10/2019   Rcvd treatment through [facility]   Migraine    Obese    Restless leg syndrome 12/04/2019   Wears contact lenses     Past Surgical History:  Procedure Laterality Date   BREAST BIOPSY Right 2001   neg   INGUINAL HERNIA REPAIR  1981   ROUX-EN-Y GASTRIC BYPASS  2007   TONSILLECTOMY     VAGINAL HYSTERECTOMY      Prior to Admission medications   Medication Sig Start Date End Date Taking? Authorizing Provider  butalbital-acetaminophen-caffeine (FIORICET) 50-325-40 MG tablet Take 1 tablet by mouth daily as needed for headache. 12/31/20  Yes [name], MD  ipratropium (ATROVENT) 0.03 % nasal spray Place 2 sprays into both nostrils every 12 (twelve) hours. 08/26/20  Yes Waldon Merl, PA-C  omeprazole (PRILOSEC) 20 MG capsule Take 1 capsule (20 mg total) by mouth daily. 01/21/20  Yes [name], MD  zolpidem (AMBIEN) 10 MG tablet TAKE 1 TABLET(10 MG) BY MOUTH AT BEDTIME AS NEEDED FOR SLEEP 01/13/21  Yes [name], MD  cyanocobalamin (,VITAMIN B-12,) 1000 MCG/ML injection Inject 1 mL (1,000 mcg total) into the muscle every 30 (thirty) days. 01/21/20   [name], MD  polyethylene glycol-electrolytes (GAVILYTE-N WITH FLAVOR PACK) 420 g solution Drink one 8 oz glass every 20 mins until entire container is finished starting at 5:00pm on 01/15/21 12/31/20   Christina Minium, MD    Allergies as of 12/15/2020 - Review Complete 12/15/2020  Allergen Reaction Noted   Sulfa antibiotics   09/16/2014   Oxycodone Itching 06/29/2019   Tramadol Palpitations 06/29/2019    Family History  Problem Relation Age of Onset   Breast cancer Mother 38   Breast cancer Maternal Grandmother 33    Social History   Socioeconomic History   Marital status: Married    Spouse name: Not on file   Number of children: Not on file   Years of education: Not on file   Highest education level: Not on file  Occupational History   Not on file  Tobacco Use   Smoking status: Never   Smokeless tobacco: Never  Vaping Use   Vaping Use: Never used  Substance and Sexual Activity   Alcohol use: No    Alcohol/week: 0.0 standard drinks   Drug use: No   Sexual activity: Yes    Birth control/protection: None  Other Topics Concern   Not on file  Social History Narrative   Not on file   Social Determinants of Health   Financial Resource Strain: Not on file  Food Insecurity: Not on file  Transportation Needs: Not on file  Physical Activity: Not on file  Stress: Not on file  Social Connections: Not on file  Intimate Partner Violence: Not on file    Review of Systems: See HPI, otherwise negative ROS  Physical Exam: BP 133/79   Pulse 74   Temp 98.1 F (36.7 C) (Temporal)   Ht 5\' 4"  (1.626 m)  Wt 102.1 kg   LMP 02/14/2019 (Exact Date)   SpO2 100%   BMI 38.64 kg/m  General:   Alert,  pleasant and cooperative in NAD Head:  Normocephalic and atraumatic. Neck:  Supple; no masses or thyromegaly. Lungs:  Clear throughout to auscultation.    Heart:  Regular rate and rhythm. Abdomen:  Soft, nontender and nondistended. Normal bowel sounds, without guarding, and without rebound.   Neurologic:  Alert and  oriented x4;  grossly normal neurologically.  Impression/Plan: Christina Carlson is now here to undergo a screening colonoscopy.  Risks, benefits, and alternatives regarding colonoscopy have been reviewed with the patient.  Questions have been answered.  All parties agreeable.

## 2021-01-16 NOTE — Transfer of Care (Signed)
Immediate Anesthesia Transfer of Care Note  Patient: Christina Carlson  Procedure(s) Performed: COLONOSCOPY WITH PROPOFOL (Rectum) POLYPECTOMY (Rectum)  Patient Location: PACU  Anesthesia Type: General  Level of Consciousness: awake, alert  and patient cooperative  Airway and Oxygen Therapy: Patient Spontanous Breathing and Patient connected to supplemental oxygen  Post-op Assessment: Post-op Vital signs reviewed, Patient's Cardiovascular Status Stable, Respiratory Function Stable, Patent Airway and No signs of Nausea or vomiting  Post-op Vital Signs: Reviewed and stable  Complications: No notable events documented.

## 2021-01-16 NOTE — Anesthesia Postprocedure Evaluation (Signed)
Anesthesia Post Note  Patient: Christina Carlson  Procedure(s) Performed: COLONOSCOPY WITH PROPOFOL (Rectum) POLYPECTOMY (Rectum)     Patient location during evaluation: PACU Anesthesia Type: General Level of consciousness: awake and alert Pain management: pain level controlled Vital Signs Assessment: post-procedure vital signs reviewed and stable Respiratory status: spontaneous breathing, nonlabored ventilation, respiratory function stable and patient connected to nasal cannula oxygen Cardiovascular status: blood pressure returned to baseline and stable Postop Assessment: no apparent nausea or vomiting Anesthetic complications: no   No notable events documented.  Elward Nocera, Salvadore Dom

## 2021-01-16 NOTE — Anesthesia Preprocedure Evaluation (Signed)
Anesthesia Evaluation  Patient identified by MRN, date of birth, ID band Patient awake    Reviewed: Allergy & Precautions, H&P , NPO status , Patient's Chart, lab work & pertinent test results, reviewed documented beta blocker date and time   Airway Mallampati: II  TM Distance: >3 FB Neck ROM: full    Dental no notable dental hx.    Pulmonary neg pulmonary ROS,    Pulmonary exam normal breath sounds clear to auscultation       Cardiovascular Exercise Tolerance: Good negative cardio ROS   Rhythm:regular Rate:Normal     Neuro/Psych  Headaches, negative psych ROS   GI/Hepatic Neg liver ROS, GERD  ,  Endo/Other  negative endocrine ROS  Renal/GU negative Renal ROS  negative genitourinary   Musculoskeletal   Abdominal   Peds  Hematology negative hematology ROS (+)   Anesthesia Other Findings   Reproductive/Obstetrics negative OB ROS                            Anesthesia Physical Anesthesia Plan  ASA: 2  Anesthesia Plan: General   Post-op Pain Management:    Induction:   PONV Risk Score and Plan: 3 and Propofol infusion, Treatment may vary due to age or medical condition and TIVA  Airway Management Planned:   Additional Equipment:   Intra-op Plan:   Post-operative Plan:   Informed Consent: I have reviewed the patients History and Physical, chart, labs and discussed the procedure including the risks, benefits and alternatives for the proposed anesthesia with the patient or authorized representative who has indicated his/her understanding and acceptance.     Dental Advisory Given  Plan Discussed with: CRNA  Anesthesia Plan Comments:        Anesthesia Quick Evaluation

## 2021-01-16 NOTE — Op Note (Signed)
[facility] Gastroenterology Patient Name: Christina Carlson Procedure Date: 01/16/2021 9:32 AM MRN: [id number] Account #: [id number] Date of Birth: 1976/01/19 Admit Type: Outpatient Age: 45 Room: [facility] OR ROOM 01 Gender: Female Note Status: Finalized Procedure:             Colonoscopy Indications:           Screening for colorectal malignant neoplasm Providers:             Midge Minium MD, MD Referring MD:          Bari Edward, MD (Referring MD) Medicines:             Propofol per Anesthesia Complications:         No immediate complications. Procedure:             Pre-Anesthesia Assessment:                        - Prior to the procedure, a History and Physical was                         performed, and patient medications and allergies were                         reviewed. The patient's tolerance of previous                         anesthesia was also reviewed. The risks and benefits                         of the procedure and the sedation options and risks                         were discussed with the patient. All questions were                         answered, and informed consent was obtained. Prior                         Anticoagulants: The patient has taken no previous                         anticoagulant or antiplatelet agents. ASA Grade                         Assessment: II - A patient with mild systemic disease.                         After reviewing the risks and benefits, the patient                         was deemed in satisfactory condition to undergo the                         procedure.                        After obtaining informed consent, the colonoscope was  passed under direct vision. Throughout the procedure,                         the patient's blood pressure, pulse, and oxygen                         saturations were monitored continuously. The                         Colonoscope was introduced through the  anus and                         advanced to the the cecum, identified by appendiceal                         orifice and ileocecal valve. The colonoscopy was                         performed without difficulty. The patient tolerated                         the procedure well. The quality of the bowel                         preparation was excellent. Findings:      The perianal and digital rectal examinations were normal.      Two sessile polyps were found in the transverse colon. The polyps were 3       to 4 mm in size. These polyps were removed with a cold snare. Resection       and retrieval were complete.      Non-bleeding internal hemorrhoids were found during retroflexion. The       hemorrhoids were Grade I (internal hemorrhoids that do not prolapse). Impression:            - Two 3 to 4 mm polyps in the transverse colon,                         removed with a cold snare. Resected and retrieved.                        - Non-bleeding internal hemorrhoids. Recommendation:        - Discharge patient to home.                        - Resume previous diet.                        - Continue present medications. Procedure Code(s):     --- Professional ---                        [phone], Colonoscopy, flexible; with removal of                         tumor(s), polyp(s), or other lesion(s) by snare                         technique Diagnosis Code(s):     --- Professional ---  Z12.11, Encounter for screening for malignant neoplasm                         of colon                        K63.5, Polyp of colon CPT copyright 2019 American Medical Association. All rights reserved. The codes documented in this report are preliminary and upon coder review may  be revised to meet current compliance requirements. Midge Minium MD, MD 01/16/2021 9:58:54 AM This report has been signed electronically. Number of Addenda: 0 Note Initiated On: 01/16/2021 9:32 AM Total Procedure  Duration: 0 hours 13 minutes 5 seconds  Estimated Blood Loss:  Estimated blood loss: none.      [facility]

## 2021-01-19 ENCOUNTER — Encounter: Payer: Self-pay | Admitting: Gastroenterology

## 2021-01-19 LAB — SURGICAL PATHOLOGY

## 2021-01-20 ENCOUNTER — Other Ambulatory Visit: Payer: Self-pay

## 2021-01-20 ENCOUNTER — Ambulatory Visit (INDEPENDENT_AMBULATORY_CARE_PROVIDER_SITE_OTHER): Payer: BC Managed Care – PPO

## 2021-01-20 DIAGNOSIS — E538 Deficiency of other specified B group vitamins: Secondary | ICD-10-CM | POA: Diagnosis not present

## 2021-01-20 MED ORDER — CYANOCOBALAMIN 1000 MCG/ML IJ SOLN
1000.0000 ug | Freq: Once | INTRAMUSCULAR | Status: AC
  Administered 2021-01-20: 1000 ug via INTRAMUSCULAR

## 2021-01-20 NOTE — Progress Notes (Signed)
Patient came in today to receive B12 injection. Patient was brought to ROOM 2. Patient was given injection while sitting in the chair. Patient tolerated injection well and had no complaints.

## 2021-01-22 ENCOUNTER — Encounter: Payer: Self-pay | Admitting: Gastroenterology

## 2021-01-27 DIAGNOSIS — S83422A Sprain of lateral collateral ligament of left knee, initial encounter: Secondary | ICD-10-CM | POA: Diagnosis not present

## 2021-02-25 ENCOUNTER — Other Ambulatory Visit: Payer: Self-pay

## 2021-02-25 ENCOUNTER — Encounter: Payer: Self-pay | Admitting: Internal Medicine

## 2021-02-25 MED ORDER — CYANOCOBALAMIN 1000 MCG/ML IJ SOLN: 1000.0000 ug | INTRAMUSCULAR | 5 refills | Status: DC

## 2021-03-10 ENCOUNTER — Other Ambulatory Visit: Payer: Self-pay | Admitting: Internal Medicine

## 2021-03-10 DIAGNOSIS — G44209 Tension-type headache, unspecified, not intractable: Secondary | ICD-10-CM

## 2021-03-10 NOTE — Telephone Encounter (Signed)
Requested medications are due for refill today.  yes  Requested medications are on the active medications list.  yes  Last refill. 12/31/2020  Future visit scheduled.   yes  Notes to clinic.  Medication not delegated. 

## 2021-03-11 ENCOUNTER — Other Ambulatory Visit: Payer: Self-pay | Admitting: Internal Medicine

## 2021-03-11 NOTE — Telephone Encounter (Signed)
Please review. Last office visit 11/10/20.  KP

## 2021-03-22 ENCOUNTER — Encounter: Payer: Self-pay | Admitting: Internal Medicine

## 2021-03-23 ENCOUNTER — Other Ambulatory Visit: Payer: Self-pay | Admitting: Internal Medicine

## 2021-03-23 DIAGNOSIS — F5101 Primary insomnia: Secondary | ICD-10-CM

## 2021-03-23 MED ORDER — ZOLPIDEM TARTRATE ER 12.5 MG PO TBCR: 12.5000 mg | EXTENDED_RELEASE_TABLET | Freq: Every evening | ORAL | 0 refills | Status: DC | PRN

## 2021-03-29 ENCOUNTER — Encounter: Payer: Self-pay | Admitting: Internal Medicine

## 2021-03-30 ENCOUNTER — Encounter: Payer: Self-pay | Admitting: Internal Medicine

## 2021-04-03 ENCOUNTER — Other Ambulatory Visit: Payer: Self-pay | Admitting: Internal Medicine

## 2021-04-03 DIAGNOSIS — G44209 Tension-type headache, unspecified, not intractable: Secondary | ICD-10-CM

## 2021-04-03 NOTE — Telephone Encounter (Signed)
Requested medication (s) are due for refill today: Yes  Requested medication (s) are on the active medication list: Yes  Last refill:  03/11/2021  Future visit scheduled: Yes  Notes to clinic:  Unable to refill per protocol, cannot delegate.      Requested Prescriptions  Pending Prescriptions Disp Refills   butalbital-acetaminophen-caffeine (FIORICET) 50-325-40 MG tablet [Pharmacy Med Name: BUT/ACETAMINOPHEN/CAFF 50-325-40 TB] 20 tablet     Sig: TAKE 1 TABLET BY MOUTH DAILY AS NEEDED FOR HEADACHE     Not Delegated - Analgesics:  Non-Opioid Analgesic Combinations Failed - 04/03/2021 12:27 PM      Failed - This refill cannot be delegated      Passed - Valid encounter within last 12 months    Recent Outpatient Visits           4 months ago Annual physical exam   [facility] [name], MD   1 year ago Acute non intractable tension-type headache   [facility] [name], MD   1 year ago B12 deficiency   [facility] [name], MD   1 year ago Acute recurrent maxillary sinusitis   [facility] [name], MD   2 years ago Uterine prolapse   [facility] [name], MD       Future Appointments             In 7 months Judithann Graves Nyoka Cowden, MD [facility], [facility]

## 2021-04-12 ENCOUNTER — Other Ambulatory Visit: Payer: Self-pay | Admitting: Internal Medicine

## 2021-04-12 ENCOUNTER — Encounter: Payer: Self-pay | Admitting: Internal Medicine

## 2021-04-12 DIAGNOSIS — F5101 Primary insomnia: Secondary | ICD-10-CM

## 2021-04-12 MED ORDER — ZOLPIDEM TARTRATE ER 12.5 MG PO TBCR: 12.5000 mg | EXTENDED_RELEASE_TABLET | Freq: Every evening | ORAL | 0 refills | Status: DC | PRN

## 2021-05-15 ENCOUNTER — Ambulatory Visit: Payer: BC Managed Care – PPO | Admitting: Internal Medicine

## 2021-05-15 ENCOUNTER — Other Ambulatory Visit: Payer: Self-pay

## 2021-05-15 ENCOUNTER — Encounter: Payer: Self-pay | Admitting: Internal Medicine

## 2021-05-15 VITALS — BP 104/78 | HR 76 | Ht 64.0 in | Wt 231.0 lb

## 2021-05-15 DIAGNOSIS — Z23 Encounter for immunization: Secondary | ICD-10-CM

## 2021-05-15 DIAGNOSIS — E538 Deficiency of other specified B group vitamins: Secondary | ICD-10-CM

## 2021-05-15 DIAGNOSIS — F5101 Primary insomnia: Secondary | ICD-10-CM

## 2021-05-15 DIAGNOSIS — F419 Anxiety disorder, unspecified: Secondary | ICD-10-CM | POA: Diagnosis not present

## 2021-05-15 MED ORDER — CYANOCOBALAMIN 1000 MCG/ML IJ SOLN: 1000.0000 ug | INTRAMUSCULAR | 5 refills | Status: DC

## 2021-05-15 MED ORDER — ALPRAZOLAM 0.5 MG PO TABS
0.5000 mg | ORAL_TABLET | Freq: Every evening | ORAL | 0 refills | Status: DC | PRN
Start: 2021-05-15 — End: 2021-06-04

## 2021-05-15 NOTE — Progress Notes (Signed)
Date:  05/15/2021   Name:  Christina Carlson   DOB:  18-Jun-1975   MRN:  [id number]   Chief Complaint: Insomnia  Insomnia Primary symptoms: sleep disturbance.   The problem occurs nightly. The problem has been gradually improving since onset.  Anxiety Presents for initial visit. Onset was at an unknown time. The problem has been waxing and waning. Symptoms include excessive worry, insomnia and nervous/anxious behavior. Patient reports no chest pain, dizziness, palpitations or shortness of breath. Symptoms occur occasionally. The symptoms are aggravated by family issues (father with stage 4 colon cancer and mother with dementia). The quality of sleep is good (when she can sleep).     Lab Results  Component Value Date   NA 138 11/10/2020   K 4.4 11/10/2020   CO2 20 11/10/2020   GLUCOSE 73 11/10/2020   BUN 9 11/10/2020   CREATININE 0.79 11/10/2020   CALCIUM 9.0 11/10/2020   EGFR 93 11/10/2020   GFRNONAA 87 10/05/2018   Lab Results  Component Value Date   CHOL 197 11/10/2020   HDL 54 11/10/2020   LDLCALC 125 (H) 11/10/2020   TRIG 98 11/10/2020   CHOLHDL 3.6 11/10/2020   Lab Results  Component Value Date   TSH 2.510 11/10/2020   Lab Results  Component Value Date   HGBA1C 5.3 09/14/2017   Lab Results  Component Value Date   WBC 6.6 11/10/2020   HGB 11.2 11/10/2020   HCT 35.0 11/10/2020   MCV 86 11/10/2020   PLT 343 11/10/2020   Lab Results  Component Value Date   ALT 6 11/10/2020   AST 9 11/10/2020   ALKPHOS 100 11/10/2020   BILITOT <0.2 11/10/2020   Lab Results  Component Value Date   VD25OH 43.1 11/26/2015     Review of Systems  Constitutional:  Negative for fatigue, fever and unexpected weight change.  Respiratory:  Negative for shortness of breath.   Cardiovascular:  Negative for chest pain and palpitations.  Neurological:  Negative for dizziness and headaches.  Psychiatric/Behavioral:  Positive for sleep disturbance. Negative for dysphoric mood.  The patient is nervous/anxious and has insomnia.    Patient Active Problem List   Diagnosis Date Noted   Colon cancer screening    Polyp of transverse colon    Atypical squamous cells of undetermined significance on cytologic smear of cervix (ASC-US) 06/27/2015   Vitamin B12 nutritional deficiency 05/07/2015   LBP (low back pain) 09/16/2014   Bariatric surgery status 09/16/2014   Gastro-esophageal reflux disease without esophagitis 09/16/2014   H/O motion sickness 09/16/2014   Migraine without aura and responsive to treatment 09/16/2014   Idiopathic insomnia 09/16/2014    Allergies  Allergen Reactions   Sulfa Antibiotics Hives   Oxycodone Itching   Tramadol Palpitations    Past Surgical History:  Procedure Laterality Date   BREAST BIOPSY Right 2001   neg   COLONOSCOPY WITH PROPOFOL N/A 01/16/2021   Procedure: COLONOSCOPY WITH PROPOFOL;  Surgeon: Lucilla Lame, MD;  Location: [place];  Service: Endoscopy;  Laterality: N/A;   INGUINAL HERNIA REPAIR  1981   POLYPECTOMY  01/16/2021   Procedure: POLYPECTOMY;  Surgeon: Lucilla Lame, MD;  Location: [place];  Service: Endoscopy;;   ROUX-EN-Y GASTRIC BYPASS  2007   TONSILLECTOMY     VAGINAL HYSTERECTOMY      Social History   Tobacco Use   Smoking status: Never   Smokeless tobacco: Never  Vaping Use   Vaping Use: Never used  Substance Use Topics   Alcohol use: No    Alcohol/week: 0.0 standard drinks   Drug use: No     Medication list has been reviewed and updated.  Current Meds  Medication Sig   butalbital-acetaminophen-caffeine (FIORICET) 50-325-40 MG tablet TAKE 1 TABLET BY MOUTH DAILY AS NEEDED FOR HEADACHE   cyanocobalamin (,VITAMIN B-12,) 1000 MCG/ML injection Inject 1 mL (1,000 mcg total) into the muscle every 30 (thirty) days.   omeprazole (PRILOSEC) 20 MG capsule Take 1 capsule (20 mg total) by mouth daily.   zolpidem (AMBIEN CR) 12.5 MG CR tablet Take 1 tablet (12.5 mg total) by mouth at  bedtime as needed for sleep.    PHQ 2/9 Scores 05/15/2021 11/10/2020 12/04/2019 05/02/2019  PHQ - 2 Score 0 0 0 0  PHQ- 9 Score 0 4 1 -    GAD 7 : Generalized Anxiety Score 05/15/2021 11/10/2020 12/04/2019  Nervous, Anxious, on Edge 0 0 0  Control/stop worrying 1 0 0  Worry too much - different things 1 0 0  Trouble relaxing 1 1 0  Restless 0 0 0  Easily annoyed or irritable 1 0 0  Afraid - awful might happen 0 0 0  Total GAD 7 Score 4 1 0  Anxiety Difficulty - - Not difficult at all    BP Readings from Last 3 Encounters:  05/15/21 104/78  01/16/21 124/82  11/10/20 106/82    Physical Exam Vitals and nursing note reviewed.  Constitutional:      General: She is not in acute distress.    Appearance: She is well-developed.  HENT:     Head: Normocephalic and atraumatic.  Cardiovascular:     Rate and Rhythm: Normal rate and regular rhythm.  Pulmonary:     Effort: Pulmonary effort is normal. No respiratory distress.     Breath sounds: No wheezing or rhonchi.  Skin:    General: Skin is warm and dry.     Findings: No rash.  Neurological:     Mental Status: She is alert and oriented to person, place, and time.  Psychiatric:        Mood and Affect: Mood normal.        Behavior: Behavior normal.    Wt Readings from Last 3 Encounters:  05/15/21 231 lb (104.8 kg)  01/16/21 225 lb 1.6 oz (102.1 kg)  11/10/20 230 lb (104.3 kg)    BP 104/78    Pulse 76    Ht 5' 4"  (1.626 m)    Wt 231 lb (104.8 kg)    LMP 02/14/2019 (Exact Date)    SpO2 97%    BMI 39.65 kg/m   Assessment and Plan: 1. Primary insomnia Weaned off of ambien due to memory concerns Tried Melatonin but had dizziness  2. Need for immunization against influenza - Flu Vaccine QUAD 3moIM (Fluarix, Fluzone & Alfiuria Quad PF)  3. Anxiety, mild Related to family health issues Will try xanax at bedtime PRN only - ALPRAZolam (XANAX) 0.5 MG tablet; Take 1 tablet (0.5 mg total) by mouth at bedtime as needed for anxiety.   Dispense: 20 tablet; Refill: 0  4. Vitamin B12 nutritional deficiency - cyanocobalamin (,VITAMIN B-12,) 1000 MCG/ML injection; Inject 1 mL (1,000 mcg total) into the muscle every 30 (thirty) days.  Dispense: 1 mL; Refill: 5   Partially dictated using DEditor, commissioning Any errors are unintentional.  LHalina Maidens MD [email or web address]  05/15/2021

## 2021-05-22 ENCOUNTER — Encounter: Payer: Self-pay | Admitting: Internal Medicine

## 2021-05-22 ENCOUNTER — Other Ambulatory Visit: Payer: Self-pay | Admitting: Internal Medicine

## 2021-05-22 DIAGNOSIS — F5101 Primary insomnia: Secondary | ICD-10-CM

## 2021-05-22 MED ORDER — ZOLPIDEM TARTRATE ER 12.5 MG PO TBCR: 12.5000 mg | EXTENDED_RELEASE_TABLET | Freq: Every evening | ORAL | 0 refills | Status: DC | PRN

## 2021-05-22 NOTE — Telephone Encounter (Signed)
Please review. Last office visit 05/15/2021.  KP

## 2021-05-27 ENCOUNTER — Encounter: Payer: Self-pay | Admitting: Internal Medicine

## 2021-05-29 ENCOUNTER — Encounter: Payer: Self-pay | Admitting: Internal Medicine

## 2021-06-04 ENCOUNTER — Other Ambulatory Visit: Payer: Self-pay | Admitting: Internal Medicine

## 2021-06-04 DIAGNOSIS — F419 Anxiety disorder, unspecified: Secondary | ICD-10-CM

## 2021-06-04 NOTE — Telephone Encounter (Signed)
Please review.  KP

## 2021-06-05 MED ORDER — ALPRAZOLAM 0.5 MG PO TABS: 0.5000 mg | ORAL_TABLET | Freq: Every evening | ORAL | 0 refills | Status: DC | PRN

## 2021-06-11 ENCOUNTER — Encounter: Payer: Self-pay | Admitting: Internal Medicine

## 2021-06-24 ENCOUNTER — Other Ambulatory Visit: Payer: Self-pay | Admitting: Internal Medicine

## 2021-06-24 DIAGNOSIS — F5101 Primary insomnia: Secondary | ICD-10-CM

## 2021-06-25 ENCOUNTER — Other Ambulatory Visit: Payer: Self-pay | Admitting: Internal Medicine

## 2021-06-25 NOTE — Telephone Encounter (Signed)
Please review. Last office visit 05/15/2021.  KP

## 2021-06-25 NOTE — Telephone Encounter (Signed)
Requested medication (s) are due for refill today: Unclear, OV note states weaned off, new order for Xanax  Requested medication (s) are on the active medication list: yes  Last refill:  04/2321 #30 no RF  Future visit scheduled: 11/12/21, seen 05/15/21  Notes to clinic:  This medication can not be delegated, please assess.  Requested Prescriptions  Pending Prescriptions Disp Refills   zolpidem (AMBIEN CR) 12.5 MG CR tablet [Pharmacy Med Name: ZOLPIDEM ER 12.5MG  TABLETS] 30 tablet     Sig: TAKE 1 TABLET(12.5 MG) BY MOUTH AT BEDTIME AS NEEDED FOR SLEEP     Not Delegated - Psychiatry:  Anxiolytics/Hypnotics Failed - 06/24/2021  9:57 PM      Failed - This refill cannot be delegated      Failed - Urine Drug Screen completed in last 360 days      Passed - Valid encounter within last 6 months    Recent Outpatient Visits           1 month ago Need for immunization against influenza   [facility] [name], MD   7 months ago Annual physical exam   [facility] [name], MD   1 year ago Acute non intractable tension-type headache   [facility] [name], MD   2 years ago B12 deficiency   [facility] [name], MD   2 years ago Acute recurrent maxillary sinusitis   [facility] [name], MD       Future Appointments             In 4 months Judithann Graves Nyoka Cowden, MD [facility], [facility]

## 2021-07-03 ENCOUNTER — Other Ambulatory Visit: Payer: Self-pay | Admitting: Internal Medicine

## 2021-07-03 DIAGNOSIS — F5101 Primary insomnia: Secondary | ICD-10-CM

## 2021-07-03 NOTE — Telephone Encounter (Signed)
Requested medications are due for refill today.  no  Requested medications are on the active medications list.  no  Last refill. 01/13/2021  Future visit scheduled.   yes  Notes to clinic.  Medication not delegated. Medication was d/c'd 03/23/2021. Medication was refused.    Requested Prescriptions  Pending Prescriptions Disp Refills   zolpidem (AMBIEN) 10 MG tablet [Pharmacy Med Name: ZOLPIDEM 10MG  TABLETS] 30 tablet     Sig: TAKE 1 TABLET(10 MG) BY MOUTH AT BEDTIME AS NEEDED FOR SLEEP     Not Delegated - Psychiatry:  Anxiolytics/Hypnotics Failed - 07/03/2021  8:41 PM      Failed - This refill cannot be delegated      Failed - Urine Drug Screen completed in last 360 days      Passed - Valid encounter within last 6 months    Recent Outpatient Visits           1 month ago Need for immunization against influenza   [facility], MD   7 months ago Annual physical exam   [facility], MD   1 year ago Acute non intractable tension-type headache   [facility] [name], MD   2 years ago B12 deficiency   [facility], MD   2 years ago Acute recurrent maxillary sinusitis   [facility], MD       Future Appointments             In 4 months [name] Judithann Graves, MD [facility], [facility]

## 2021-07-06 ENCOUNTER — Other Ambulatory Visit: Payer: Self-pay | Admitting: Internal Medicine

## 2021-07-06 DIAGNOSIS — F5101 Primary insomnia: Secondary | ICD-10-CM

## 2021-07-06 MED ORDER — ZOLPIDEM TARTRATE ER 12.5 MG PO TBCR: 12.5000 mg | EXTENDED_RELEASE_TABLET | Freq: Every evening | ORAL | 3 refills | Status: DC | PRN

## 2021-07-06 NOTE — Telephone Encounter (Signed)
Please review. Last office visit 05/15/2021.  KP

## 2021-07-06 NOTE — Telephone Encounter (Signed)
Pt. Reports she is no longer having memory issues. States she thinks "it was related to some stress I was having and that stress is much better." Requesting refill on Ambien. Please advise pt.

## 2021-07-06 NOTE — Telephone Encounter (Signed)
Called pt left VM to call back. ? ?[facility] nurse may give results to patient if they return call to clinic, a CRM has been created. ? ?KP

## 2021-07-17 ENCOUNTER — Other Ambulatory Visit: Payer: Self-pay | Admitting: Internal Medicine

## 2021-07-17 DIAGNOSIS — G44209 Tension-type headache, unspecified, not intractable: Secondary | ICD-10-CM

## 2021-07-17 MED ORDER — BUTALBITAL-APAP-CAFFEINE 50-325-40 MG PO TABS: 1.0000 | ORAL_TABLET | Freq: Every day | ORAL | 0 refills | Status: DC | PRN

## 2021-07-17 NOTE — Telephone Encounter (Signed)
Refill if appropriate.  Please advise.  

## 2021-07-27 ENCOUNTER — Encounter: Payer: Self-pay | Admitting: Internal Medicine

## 2021-08-13 ENCOUNTER — Encounter: Payer: Self-pay | Admitting: Internal Medicine

## 2021-08-17 ENCOUNTER — Other Ambulatory Visit: Payer: Self-pay

## 2021-08-17 ENCOUNTER — Telehealth: Payer: Self-pay

## 2021-08-17 ENCOUNTER — Ambulatory Visit: Payer: BC Managed Care – PPO | Admitting: Internal Medicine

## 2021-08-17 ENCOUNTER — Encounter: Payer: Self-pay | Admitting: Internal Medicine

## 2021-08-17 VITALS — BP 122/62 | HR 104 | Ht 64.0 in | Wt 230.2 lb

## 2021-08-17 DIAGNOSIS — Z6839 Body mass index (BMI) 39.0-39.9, adult: Secondary | ICD-10-CM

## 2021-08-17 DIAGNOSIS — K219 Gastro-esophageal reflux disease without esophagitis: Secondary | ICD-10-CM | POA: Diagnosis not present

## 2021-08-17 DIAGNOSIS — E538 Deficiency of other specified B group vitamins: Secondary | ICD-10-CM

## 2021-08-17 MED ORDER — CYANOCOBALAMIN 1000 MCG/ML IJ SOLN: 1000.0000 ug | INTRAMUSCULAR | 5 refills | Status: DC

## 2021-08-17 MED ORDER — SEMAGLUTIDE-WEIGHT MANAGEMENT 0.5 MG/0.5ML ~~LOC~~ SOAJ: 0.5000 mg | SUBCUTANEOUS | 0 refills | Status: DC

## 2021-08-17 NOTE — Telephone Encounter (Signed)
Completed PA on [email or web address] for Wegovy 0.5. ? ?Key: [id number]) ?Rx #: [id number] ?Wegovy 0.5MG /0.5ML auto-injectors ? ?Waiting for outcome. ?

## 2021-08-17 NOTE — Progress Notes (Signed)
? ? ?Date:  08/17/2021  ? ?Name:  Christina Carlson   DOB:  Jan 17, 1976   MRN:  [id number] ? ? ?Chief Complaint: Obesity (/) ? ?HPI ?Obesity:  s/p gastric bypass in 2007.  Lowest weight after surgery recorded here 190 in 2016.  Since 2020 has been slowly gaining since her hysterectomy.  Walking for exercise and on her feet at work.  Trying to eat less carbs but weight is not coming off. ? ? ?Lab Results  ?Component Value Date  ? NA 138 11/10/2020  ? K 4.4 11/10/2020  ? CO2 20 11/10/2020  ? GLUCOSE 73 11/10/2020  ? BUN 9 11/10/2020  ? CREATININE 0.79 11/10/2020  ? CALCIUM 9.0 11/10/2020  ? EGFR 93 11/10/2020  ? GFRNONAA 87 10/05/2018  ? ?Lab Results  ?Component Value Date  ? CHOL 197 11/10/2020  ? HDL 54 11/10/2020  ? LDLCALC 125 (H) 11/10/2020  ? TRIG 98 11/10/2020  ? CHOLHDL 3.6 11/10/2020  ? ?Lab Results  ?Component Value Date  ? TSH 2.510 11/10/2020  ? ?Lab Results  ?Component Value Date  ? HGBA1C 5.3 09/14/2017  ? ?Lab Results  ?Component Value Date  ? WBC 6.6 11/10/2020  ? HGB 11.2 11/10/2020  ? HCT 35.0 11/10/2020  ? MCV 86 11/10/2020  ? PLT 343 11/10/2020  ? ?Lab Results  ?Component Value Date  ? ALT 6 11/10/2020  ? AST 9 11/10/2020  ? ALKPHOS 100 11/10/2020  ? BILITOT <0.2 11/10/2020  ? ?Lab Results  ?Component Value Date  ? VD25OH 43.1 11/26/2015  ?  ? ?Review of Systems  ?Constitutional:  Negative for fatigue and unexpected weight change.  ?HENT:  Negative for nosebleeds.   ?Eyes:  Negative for visual disturbance.  ?Respiratory:  Negative for cough, chest tightness, shortness of breath and wheezing.   ?Cardiovascular:  Negative for chest pain, palpitations and leg swelling.  ?Gastrointestinal:  Negative for abdominal pain, constipation and diarrhea.  ?Neurological:  Negative for dizziness, weakness, light-headedness and headaches.  ? ?Patient Active Problem List  ? Diagnosis Date Noted  ? Colon cancer screening   ? Polyp of transverse colon   ? Atypical squamous cells of undetermined significance on cytologic  smear of cervix (ASC-US) 06/27/2015  ? Vitamin B12 nutritional deficiency 05/07/2015  ? LBP (low back pain) 09/16/2014  ? Bariatric surgery status 09/16/2014  ? Gastro-esophageal reflux disease without esophagitis 09/16/2014  ? H/O motion sickness 09/16/2014  ? Migraine without aura and responsive to treatment 09/16/2014  ? Idiopathic insomnia 09/16/2014  ? ? ?Allergies  ?Allergen Reactions  ? Sulfa Antibiotics Hives  ? Oxycodone Itching  ? Tramadol Palpitations  ? ? ?Past Surgical History:  ?Procedure Laterality Date  ? BREAST BIOPSY Right 2001  ? neg  ? COLONOSCOPY WITH PROPOFOL N/A 01/16/2021  ? Procedure: COLONOSCOPY WITH PROPOFOL;  Surgeon: Lucilla Lame, MD;  Location: [place];  Service: Endoscopy;  Laterality: N/A;  ? [place]  ? POLYPECTOMY  01/16/2021  ? Procedure: POLYPECTOMY;  Surgeon: Lucilla Lame, MD;  Location: [place];  Service: Endoscopy;;  ? ROUX-EN-Y GASTRIC BYPASS  2007  ? TONSILLECTOMY    ? VAGINAL HYSTERECTOMY    ? ? ?Social History  ? ?Tobacco Use  ? Smoking status: Never  ? Smokeless tobacco: Never  ?Vaping Use  ? Vaping Use: Never used  ?Substance Use Topics  ? Alcohol use: No  ?  Alcohol/week: 0.0 standard drinks  ? Drug use: No  ? ? ? ?Medication  list has been reviewed and updated. ? ?Current Meds  ?Medication Sig  ? ALPRAZolam (XANAX) 0.5 MG tablet Take 1 tablet (0.5 mg total) by mouth at bedtime as needed for anxiety.  ? butalbital-acetaminophen-caffeine (FIORICET) 50-325-40 MG tablet Take 1 tablet by mouth daily as needed for headache.  ? cyanocobalamin (,VITAMIN B-12,) 1000 MCG/ML injection Inject 1 mL (1,000 mcg total) into the muscle every 30 (thirty) days.  ? ipratropium (ATROVENT) 0.03 % nasal spray Place 2 sprays into both nostrils every 12 (twelve) hours.  ? omeprazole (PRILOSEC) 20 MG capsule Take 1 capsule (20 mg total) by mouth daily.  ? zolpidem (AMBIEN CR) 12.5 MG CR tablet Take 1 tablet (12.5 mg total) by mouth at bedtime as needed  for sleep.  ? ? ?PHQ 2/9 Scores 05/15/2021 11/10/2020 12/04/2019 05/02/2019  ?PHQ - 2 Score 0 0 0 0  ?PHQ- 9 Score 0 4 1 -  ? ? ?GAD 7 : Generalized Anxiety Score 05/15/2021 11/10/2020 12/04/2019  ?Nervous, Anxious, on Edge 0 0 0  ?Control/stop worrying 1 0 0  ?Worry too much - different things 1 0 0  ?Trouble relaxing 1 1 0  ?Restless 0 0 0  ?Easily annoyed or irritable 1 0 0  ?Afraid - awful might happen 0 0 0  ?Total GAD 7 Score 4 1 0  ?Anxiety Difficulty - - Not difficult at all  ? ? ?BP Readings from Last 3 Encounters:  ?08/17/21 122/62  ?05/15/21 104/78  ?01/16/21 124/82  ? ? ?Physical Exam ?Vitals and nursing note reviewed.  ?Constitutional:   ?   General: She is not in acute distress. ?   Appearance: Normal appearance. She is well-developed.  ?HENT:  ?   Head: Normocephalic and atraumatic.  ?Cardiovascular:  ?   Rate and Rhythm: Normal rate and regular rhythm.  ?Pulmonary:  ?   Effort: Pulmonary effort is normal. No respiratory distress.  ?   Breath sounds: No wheezing or rhonchi.  ?Musculoskeletal:  ?   Cervical back: Normal range of motion.  ?   Right lower leg: No edema.  ?   Left lower leg: No edema.  ?Lymphadenopathy:  ?   Cervical: No cervical adenopathy.  ?Skin: ?   General: Skin is warm and dry.  ?   Findings: No rash.  ?Neurological:  ?   Mental Status: She is alert and oriented to person, place, and time.  ?Psychiatric:     ?   Mood and Affect: Mood normal.     ?   Behavior: Behavior normal.  ? ? ?Wt Readings from Last 3 Encounters:  ?08/17/21 230 lb 3.2 oz (104.4 kg)  ?05/15/21 231 lb (104.8 kg)  ?01/16/21 225 lb 1.6 oz (102.1 kg)  ? ? ?BP 122/62   Pulse (!) 104   Ht 5' 4"  (1.626 m)   Wt 230 lb 3.2 oz (104.4 kg)   LMP 02/14/2019 (Exact Date)   SpO2 96%   BMI 39.51 kg/m?  ? ?Assessment and Plan: ?1. BMI 39.0-39.9,adult ?Begin Wegovy 0.25 weekly x 4 doses then 0.5 mg weekly ?Continue diet and exercise changes ?Follow up in one month ?- Semaglutide-Weight Management 0.5 MG/0.5ML SOAJ; Inject 0.5 mg  into the skin once a week.  Dispense: 2 mL; Refill: 0 ? ?2. Gastro-esophageal reflux disease without esophagitis ?Symptoms well controlled on daily PPI ?No red flag signs such as weight loss, n/v, melena ?Will continue omeprazole ? ?3. Vitamin B12 nutritional deficiency ?Increase frequency to every 2 weeks ?- cyanocobalamin (,VITAMIN  B-12,) 1000 MCG/ML injection; Inject 1 mL (1,000 mcg total) into the muscle every 14 (fourteen) days.  Dispense: 10 mL; Refill: 5 ? ? ?Partially dictated using Editor, commissioning. Any errors are unintentional. ? ?Halina Maidens, MD ?[facility] ?[facility] ? ?08/17/2021 ? ? ? ? ? ?

## 2021-08-19 ENCOUNTER — Telehealth: Payer: Self-pay

## 2021-08-19 NOTE — Telephone Encounter (Signed)
PA for [facility] denied due to "The health benefit plan does not cover the following services, supplies, drugs, or charges: Any treatment or regimen, medical or surgical, for the treatment of obesity, except for surgical treatment of morbid obesity." ? ?Patient informed. ?

## 2021-08-31 ENCOUNTER — Encounter: Payer: Self-pay | Admitting: Internal Medicine

## 2021-08-31 ENCOUNTER — Ambulatory Visit: Payer: BC Managed Care – PPO | Admitting: Internal Medicine

## 2021-08-31 VITALS — BP 112/70 | HR 75 | Resp 12 | Ht 64.0 in | Wt 226.0 lb

## 2021-08-31 DIAGNOSIS — H66002 Acute suppurative otitis media without spontaneous rupture of ear drum, left ear: Secondary | ICD-10-CM

## 2021-08-31 DIAGNOSIS — Z6839 Body mass index (BMI) 39.0-39.9, adult: Secondary | ICD-10-CM

## 2021-08-31 MED ORDER — AZITHROMYCIN 250 MG PO TABS
ORAL_TABLET | ORAL | 0 refills | Status: AC
Start: 2021-08-31 — End: 2021-09-05

## 2021-08-31 NOTE — Progress Notes (Signed)
? ? ?Date:  08/31/2021  ? ?Name:  Christina Carlson   DOB:  1975/09/25   MRN:  [id number] ? ? ?Chief Complaint: Ear Fullness ? ?Otalgia  ?There is pain in both ears. This is a new problem. The current episode started in the past 7 days. The problem occurs constantly. The problem has been gradually worsening. There has been no fever. Associated symptoms include ear discharge (clear) and a sore throat. Pertinent negatives include no coughing, diarrhea, headaches, hearing loss, rash or vomiting. She has tried ear drops for the symptoms. The treatment provided no relief.  ? ?Lab Results  ?Component Value Date  ? NA 138 11/10/2020  ? K 4.4 11/10/2020  ? CO2 20 11/10/2020  ? GLUCOSE 73 11/10/2020  ? BUN 9 11/10/2020  ? CREATININE 0.79 11/10/2020  ? CALCIUM 9.0 11/10/2020  ? EGFR 93 11/10/2020  ? GFRNONAA 87 10/05/2018  ? ?Lab Results  ?Component Value Date  ? CHOL 197 11/10/2020  ? HDL 54 11/10/2020  ? LDLCALC 125 (H) 11/10/2020  ? TRIG 98 11/10/2020  ? CHOLHDL 3.6 11/10/2020  ? ?Lab Results  ?Component Value Date  ? TSH 2.510 11/10/2020  ? ?Lab Results  ?Component Value Date  ? HGBA1C 5.3 09/14/2017  ? ?Lab Results  ?Component Value Date  ? WBC 6.6 11/10/2020  ? HGB 11.2 11/10/2020  ? HCT 35.0 11/10/2020  ? MCV 86 11/10/2020  ? PLT 343 11/10/2020  ? ?Lab Results  ?Component Value Date  ? ALT 6 11/10/2020  ? AST 9 11/10/2020  ? ALKPHOS 100 11/10/2020  ? BILITOT <0.2 11/10/2020  ? ?Lab Results  ?Component Value Date  ? VD25OH 43.1 11/26/2015  ?  ? ?Review of Systems  ?Constitutional:  Negative for chills, fatigue and fever.  ?HENT:  Positive for ear discharge (clear), ear pain and sore throat. Negative for hearing loss.   ?Respiratory:  Negative for cough.   ?Cardiovascular:  Negative for chest pain.  ?Gastrointestinal:  Negative for diarrhea and vomiting.  ?Skin:  Negative for rash.  ?Neurological:  Negative for dizziness and headaches.  ? ?Patient Active Problem List  ? Diagnosis Date Noted  ? Colon cancer screening   ?  Polyp of transverse colon   ? Atypical squamous cells of undetermined significance on cytologic smear of cervix (ASC-US) 06/27/2015  ? Vitamin B12 nutritional deficiency 05/07/2015  ? LBP (low back pain) 09/16/2014  ? Bariatric surgery status 09/16/2014  ? Gastro-esophageal reflux disease without esophagitis 09/16/2014  ? H/O motion sickness 09/16/2014  ? Migraine without aura and responsive to treatment 09/16/2014  ? Idiopathic insomnia 09/16/2014  ? ? ?Allergies  ?Allergen Reactions  ? Sulfa Antibiotics Hives  ? Oxycodone Itching  ? Tramadol Palpitations  ? ? ?Past Surgical History:  ?Procedure Laterality Date  ? BREAST BIOPSY Right 2001  ? neg  ? COLONOSCOPY WITH PROPOFOL N/A 01/16/2021  ? Procedure: COLONOSCOPY WITH PROPOFOL;  Surgeon: Lucilla Lame, MD;  Location: [place];  Service: Endoscopy;  Laterality: N/A;  ? [place]  ? POLYPECTOMY  01/16/2021  ? Procedure: POLYPECTOMY;  Surgeon: Lucilla Lame, MD;  Location: [place];  Service: Endoscopy;;  ? ROUX-EN-Y GASTRIC BYPASS  2007  ? TONSILLECTOMY    ? VAGINAL HYSTERECTOMY    ? ? ?Social History  ? ?Tobacco Use  ? Smoking status: Never  ? Smokeless tobacco: Never  ?Vaping Use  ? Vaping Use: Never used  ?Substance Use Topics  ? Alcohol use: No  ?  Alcohol/week: 0.0 standard drinks  ? Drug use: No  ? ? ? ?Medication list has been reviewed and updated. ? ?Current Meds  ?Medication Sig  ? ALPRAZolam (XANAX) 0.5 MG tablet Take 1 tablet (0.5 mg total) by mouth at bedtime as needed for anxiety.  ? butalbital-acetaminophen-caffeine (FIORICET) 50-325-40 MG tablet Take 1 tablet by mouth daily as needed for headache.  ? cyanocobalamin (,VITAMIN B-12,) 1000 MCG/ML injection Inject 1 mL (1,000 mcg total) into the muscle every 14 (fourteen) days.  ? ipratropium (ATROVENT) 0.03 % nasal spray Place 2 sprays into both nostrils every 12 (twelve) hours.  ? omeprazole (PRILOSEC) 20 MG capsule Take 1 capsule (20 mg total) by mouth daily.  ?  Semaglutide-Weight Management 0.5 MG/0.5ML SOAJ Inject 0.5 mg into the skin once a week.  ? zolpidem (AMBIEN CR) 12.5 MG CR tablet Take 1 tablet (12.5 mg total) by mouth at bedtime as needed for sleep.  ? azithromycin (ZITHROMAX Z-PAK) 250 MG tablet UAD  ? ? ? ?  08/17/2021  ?  2:20 PM 05/15/2021  ?  9:34 AM 11/10/2020  ? 10:42 AM 12/04/2019  ?  4:31 PM  ?GAD 7 : Generalized Anxiety Score  ?Nervous, Anxious, on Edge 0 0 0 0  ?Control/stop worrying 2 1 0 0  ?Worry too much - different things 0 1 0 0  ?Trouble relaxing 1 1 1  0  ?Restless 0 0 0 0  ?Easily annoyed or irritable 1 1 0 0  ?Afraid - awful might happen 0 0 0 0  ?Total GAD 7 Score 4 4 1  0  ?Anxiety Difficulty Not difficult at all   Not difficult at all  ? ? ? ?  08/17/2021  ?  2:20 PM  ?Depression screen PHQ 2/9  ?Decreased Interest 0  ?Down, Depressed, Hopeless 1  ?PHQ - 2 Score 1  ?Altered sleeping 0  ?Tired, decreased energy 2  ?Change in appetite 0  ?Feeling bad or failure about yourself  0  ?Trouble concentrating 0  ?Moving slowly or fidgety/restless 0  ?Suicidal thoughts 0  ?PHQ-9 Score 3  ?Difficult doing work/chores Not difficult at all  ? ? ?BP Readings from Last 3 Encounters:  ?08/31/21 112/70  ?08/17/21 122/62  ?05/15/21 104/78  ? ? ?Physical Exam ?Constitutional:   ?   Appearance: Normal appearance.  ?HENT:  ?   Right Ear: No middle ear effusion. Tympanic membrane is erythematous and retracted.  ?   Left Ear: A middle ear effusion is present. Tympanic membrane is retracted. Tympanic membrane is not erythematous.  ?   Nose:  ?   Right Sinus: No maxillary sinus tenderness.  ?   Left Sinus: No maxillary sinus tenderness.  ?   Mouth/Throat:  ?   Pharynx: Oropharynx is clear. No oropharyngeal exudate or posterior oropharyngeal erythema.  ?Cardiovascular:  ?   Rate and Rhythm: Normal rate and regular rhythm.  ?Pulmonary:  ?   Effort: Pulmonary effort is normal.  ?   Breath sounds: Normal breath sounds.  ?Musculoskeletal:  ?   Cervical back: Normal range of  motion.  ?Lymphadenopathy:  ?   Cervical: No cervical adenopathy.  ?Neurological:  ?   Mental Status: She is alert.  ? ? ?Wt Readings from Last 3 Encounters:  ?08/31/21 226 lb (102.5 kg)  ?08/17/21 230 lb 3.2 oz (104.4 kg)  ?05/15/21 231 lb (104.8 kg)  ? ? ?BP 112/70   Pulse 75   Resp 12   Ht 5' 4"  (1.626 m)  Wt 226 lb (102.5 kg)   LMP 02/14/2019 (Exact Date)   SpO2 98%   BMI 38.79 kg/m?  ? ?Assessment and Plan: ?1. Non-recurrent acute suppurative otitis media of left ear without spontaneous rupture of tympanic membrane ?Continue antihistamines, heat, tylenol for pain ?Follow up if no improvement ?- azithromycin (ZITHROMAX Z-PAK) 250 MG tablet; UAD  Dispense: 6 each; Refill: 0 ? ?2. BMI 39.0-39.9,adult ?She has lost 5 lbs so far on Wegovy 0.25m. ?However, her insurance does not cover weight loss medications.  She is considering changing to her husbands insurance or possibly starting a job with the state with different coverage. ?She will let u[place]now if insurance changes to a plan that covers [facility] ? ? ?Partially dictated using DEditor, commissioning Any errors are unintentional. ? ?LHalina Maidens MD ?[facility] ? ?08/31/2021 ? ? ? ? ? ?

## 2021-09-01 ENCOUNTER — Encounter: Payer: Self-pay | Admitting: Internal Medicine

## 2021-09-03 ENCOUNTER — Encounter: Payer: Self-pay | Admitting: Internal Medicine

## 2021-09-07 ENCOUNTER — Other Ambulatory Visit: Payer: Self-pay | Admitting: Internal Medicine

## 2021-09-07 DIAGNOSIS — F419 Anxiety disorder, unspecified: Secondary | ICD-10-CM

## 2021-09-07 MED ORDER — ALPRAZOLAM 0.5 MG PO TABS: 0.5000 mg | ORAL_TABLET | Freq: Every evening | ORAL | 0 refills | Status: DC | PRN

## 2021-09-07 NOTE — Telephone Encounter (Signed)
Requested medications are due for refill today.  no ? ?Requested medications are on the active medications list.  yes ? ?Last refill. 09/07/2021 #20  0 refills ? ?Future visit scheduled.   yes ? ?Notes to clinic.  Medication was refilled today. Medication refill or refusal is not delegated. ? ? ? ?Requested Prescriptions  ?Pending Prescriptions Disp Refills  ? ALPRAZolam (XANAX) 0.5 MG tablet [Pharmacy Med Name: ALPRAZOLAM 0.5MG  TABLETS] 20 tablet   ?  Sig: TAKE 1 TABLET(0.5 MG) BY MOUTH AT BEDTIME AS NEEDED FOR ANXIETY  ?  ? Not Delegated - Psychiatry: Anxiolytics/Hypnotics 2 Failed - 09/07/2021  8:44 AM  ?  ?  Failed - This refill cannot be delegated  ?  ?  Failed - Urine Drug Screen completed in last 360 days  ?  ?  Passed - Patient is not pregnant  ?  ?  Passed - Valid encounter within last 6 months  ?  Recent Outpatient Visits   ? ?      ? 1 week ago Non-recurrent acute suppurative otitis media of left ear without spontaneous rupture of tympanic membrane  ? [facility] [name], MD  ? 3 weeks ago BMI 39.0-39.9,adult  ? [facility] [name], MD  ? 3 months ago Need for immunization against influenza  ? [facility] [name], MD  ? 10 months ago Annual physical exam  ? [facility] [name], MD  ? 1 year ago Acute non intractable tension-type headache  ? [facility] [name], MD  ? ?  ?  ?Future Appointments   ? ?        ? In 2 weeks Judithann Graves Nyoka Cowden, MD [facility]  ? In 2 months Judithann Graves Nyoka Cowden, MD [facility], [facility]  ? ?  ? ?  ?  ?  ?  ?

## 2021-09-21 ENCOUNTER — Ambulatory Visit: Payer: BC Managed Care – PPO | Admitting: Internal Medicine

## 2021-09-28 ENCOUNTER — Telehealth: Payer: Self-pay

## 2021-09-28 NOTE — Telephone Encounter (Signed)
PA completed waiting on insurance approval. ? ?Key: [id number] ? ?KP ?

## 2021-09-28 NOTE — Telephone Encounter (Signed)
Denied pts insurance doesn't cover wegovy. ? ?KP ?

## 2021-10-06 ENCOUNTER — Encounter: Payer: Self-pay | Admitting: Internal Medicine

## 2021-10-06 ENCOUNTER — Ambulatory Visit
Admission: EM | Admit: 2021-10-06 | Discharge: 2021-10-06 | Disposition: A | Discharge status: Home / Self Care | Payer: BC Managed Care – PPO | Attending: Internal Medicine | Admitting: Internal Medicine

## 2021-10-06 ENCOUNTER — Ambulatory Visit: Payer: BC Managed Care – PPO | Admitting: Internal Medicine

## 2021-10-06 DIAGNOSIS — B349 Viral infection, unspecified: Secondary | ICD-10-CM | POA: Diagnosis not present

## 2021-10-06 DIAGNOSIS — J029 Acute pharyngitis, unspecified: Secondary | ICD-10-CM | POA: Diagnosis not present

## 2021-10-06 LAB — GROUP A STREP BY PCR: Group A Strep by PCR: NOT DETECTED

## 2021-10-06 NOTE — Discharge Instructions (Signed)
Strep test was negative today.  Symptoms and exam are consistent with a viral syndrome, which should run its course over the next several days.  A nasal steroid spray, such as flonase or nasacort, may help relieve congestion (seen on exam in ears/nose) which may be contributing to sore throat.  Push fluids and rest.  Take tylenol or advil otc as needed for fever, discomfort.  Eat fruits and vegetables to help your immune system do its best work.  Anticipate gradual improvement over the next several days.  Recheck for new fever >100.5, increasing phlegm production/nasal discharge, or if not starting to improve in a few days.   Note for work. ?

## 2021-10-06 NOTE — ED Provider Notes (Signed)
?[facility] ? ? ? ?CSN: [id number] ?Arrival date & time: 10/06/21  3300 ? ? ?  ? ?History   ?Chief Complaint ?Chief Complaint  ?Patient presents with  ? Sore Throat  ? Generalized Body Aches  ? ? ?HPI ?Christina Carlson is a 46 y.o. female.  She had the onset of malaise on Sunday 5/7.  Sore throat started yesterday on 5/8, and is worse today.  Interested in a strep test. ? ?Little bit of nausea, decreased appetite.  Not vomiting, had a little bit of diarrhea this morning.  No runny/congested nose, not coughing.  No fever. ? ?A coworker is sick as well, was exposed to somebody with GI symptoms and the coworker is having GI symptoms now. ? ? ?Sore Throat ? ? ?Past Medical History:  ?Diagnosis Date  ? Family history of breast cancer   ? 9/20 cancer genetic testing letter sent  ? 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 Rcvd treatment through [facility] Dept.  ? Migraine   ? Obese   ? Restless leg syndrome 12/04/2019  ? Wears contact lenses   ? ? ?Patient Active Problem List  ? Diagnosis Date Noted  ? Colon cancer screening   ? Polyp of transverse colon   ? Atypical squamous cells of undetermined significance on cytologic smear of cervix (ASC-US) 06/27/2015  ? Vitamin B12 nutritional deficiency 05/07/2015  ? LBP (low back pain) 09/16/2014  ? Bariatric surgery status 09/16/2014  ? Gastro-esophageal reflux disease without esophagitis 09/16/2014  ? H/O motion sickness 09/16/2014  ? Migraine without aura and responsive to treatment 09/16/2014  ? Idiopathic insomnia 09/16/2014  ? ? ?Past Surgical History:  ?Procedure Laterality Date  ? BREAST BIOPSY Right 2001  ? neg  ? COLONOSCOPY WITH PROPOFOL N/A 01/16/2021  ? Procedure: COLONOSCOPY WITH PROPOFOL;  Surgeon: Midge Minium, MD;  [facility];  Service: Endoscopy;  Laterality: N/A;  ? INGUINAL HERNIA REPAIR  1981  ? POLYPECTOMY  01/16/2021  ? Procedure: POLYPECTOMY;  Surgeon: Midge Minium, MD;  Location: [facility];  Service:  Endoscopy;;  ? ROUX-EN-Y GASTRIC BYPASS  2007  ? TONSILLECTOMY    ? VAGINAL HYSTERECTOMY    ? ? ?OB History   ? ? Gravida  ?4  ? Para  ?3  ? Term  ?   ? Preterm  ?   ? AB  ?1  ? Living  ?3  ?  ? ? SAB  ?1  ? IAB  ?   ? Ectopic  ?   ? Multiple  ?   ? Live Births  ?   ?   ?  ?  ? ? ? ?Home Medications   ? ?Prior to Admission medications   ?Medication Sig Start Date End Date Taking? Authorizing Provider  ?ALPRAZolam (XANAX) 0.5 MG tablet Take 1 tablet (0.5 mg total) by mouth at bedtime as needed for anxiety. 09/07/21  Yes [name], MD  ?butalbital-acetaminophen-caffeine [facility]) [phone] MG tablet Take 1 tablet by mouth daily as needed for headache. 07/17/21  Yes [name], MD  ?cyanocobalamin (,VITAMIN B-12,) 1000 MCG/ML injection Inject 1 mL (1,000 mcg total) into the muscle every 14 (fourteen) days. 08/17/21  Yes [name], MD  ?omeprazole (PRILOSEC) 20 MG capsule Take 1 capsule (20 mg total) by mouth daily. 01/21/20  Yes [name], MD  ?zolpidem (AMBIEN CR) 12.5 MG CR tablet Take 1 tablet (12.5 mg total) by mouth at bedtime as needed for  sleep. 07/06/21  Yes Reubin MilanBerglund, Lamaria Hildebrandt H, MD  ? ? ?Family History ?Family History  ?Problem Relation Age of Onset  ? Breast cancer Mother [id number]  ? Breast cancer Maternal Grandmother 50  ? ? ?Social History ?Social History  ? ?Tobacco Use  ? Smoking status: Never  ? Smokeless tobacco: Never  ?Vaping Use  ? Vaping Use: Never used  ?Substance Use Topics  ? Alcohol use: Yes  ? Drug use: No  ? ? ? ?Allergies   ?Sulfa antibiotics, Oxycodone, and Tramadol ? ? ?Review of Systems ?Review of Systems  see HPI ? ? ?Physical Exam ?Triage Vital Signs ?ED Triage Vitals  ?Enc Vitals Group  ?   BP 10/06/21 0932 (!) 129/91  ?   Pulse Rate 10/06/21 0932 74  ?   Resp 10/06/21 0932 18  ?   Temp 10/06/21 0932 98.1 ?F (36.7 ?C)  ?   Temp Source 10/06/21 0932 Oral  ?   SpO2 10/06/21 0932 99 %  ?   Weight 10/06/21 0930 224 lb (101.6 kg)  ?   Height 10/06/21 0930 5\' 3"  (1.6 m)  ?    Pain Score 10/06/21 0930 7  ?   Pain Loc --   ? ? ?Updated Vital Signs ?BP (!) 129/91 (BP Location: Left Arm)   Pulse 74   Temp 98.1 ?F (36.7 ?C) (Oral)   Resp 18   Ht 5\' 3"  (1.6 m)   Wt 101.6 kg   LMP 02/14/2019 (Exact Date)   SpO2 99%   BMI 39.68 kg/m?  ? ?Physical Exam ?Constitutional:   ?   General: She is not in acute distress. ?   Appearance: She is not ill-appearing or toxic-appearing.  ?   Comments: Good hygiene  ?HENT:  ?   Head: Atraumatic.  ?   Comments: Bilateral TMs are opaque, left greater than right, no erythema ?Moderate nasal congestion bilaterally ?Posterior pharynx is unremarkable ?   Mouth/Throat:  ?   Mouth: Mucous membranes are moist.  ?Eyes:  ?   Conjunctiva/sclera:  ?   Right eye: Right conjunctiva is not injected. No exudate. ?   Left eye: Left conjunctiva is not injected. No exudate. ?   Comments:   Conjugate gaze observed  ?Cardiovascular:  ?   Rate and Rhythm: Normal rate and regular rhythm.  ?Pulmonary:  ?   Effort: Pulmonary effort is normal. No respiratory distress.  ?   Breath sounds: No wheezing or rhonchi.  ?Abdominal:  ?   General: There is no distension.  ?Musculoskeletal:  ?   Cervical back: Neck supple.  ?   Comments: Walked into the urgent care independently  ?Skin: ?   General: Skin is warm and dry.  ?   Comments: No cyanosis  ?Neurological:  ?   Mental Status: She is alert.  ?   Comments: Face symmetric, speech clear, coherent, logical  ? ? ?UC Treatments / Results  ?Labs ?(all labs ordered are listed, but only abnormal results are displayed) ?Labs Reviewed  ?GROUP A STREP BY PCR  ?Strep test was negative ? ?EKG ?NA ? ?Radiology ?No results found. ?NA ? ?Procedures ?Procedures (including critical care time) ?NA ? ?Medications Ordered in UC ?Medications - No data to display ?NA ? ?Final Clinical Impressions(s) / UC Diagnoses  ? ?Final diagnoses:  ?Pharyngitis with viral syndrome  ? ? ? ?Discharge Instructions   ? ?  ?Strep test was negative today.  Symptoms and exam  are consistent with a viral syndrome,  which should run its course over the next several days.  A nasal steroid spray, such as flonase or nasacort, may help relieve congestion (seen on exam in ears/nose) which may be contributing to sore throat.  Push fluids and rest.  Take tylenol or advil otc as needed for fever, discomfort.  Eat fruits and vegetables to help your immune system do its best work.  Anticipate gradual improvement over the next several days.  Recheck for new fever >100.5, increasing phlegm production/nasal discharge, or if not starting to improve in a few days.   Note for work. ? ? ?ED Prescriptions   ?None ?  ? ?PDMP not reviewed this encounter. ?  ?Isa Rankin, MD ?10/07/21 1312 ? ?

## 2021-10-06 NOTE — ED Triage Notes (Signed)
Pt c/o sore throat, body aches, nausea, x2days ? ?Pt has been taking Ibuprofen. Last dose was 8am. ? ?Pt is not worried about covid or flu.  ? ?Pt asks for a strep test.  ?

## 2021-10-12 ENCOUNTER — Other Ambulatory Visit: Payer: Self-pay | Admitting: Internal Medicine

## 2021-10-12 DIAGNOSIS — F419 Anxiety disorder, unspecified: Secondary | ICD-10-CM

## 2021-10-12 DIAGNOSIS — G44209 Tension-type headache, unspecified, not intractable: Secondary | ICD-10-CM

## 2021-10-12 MED ORDER — BUTALBITAL-APAP-CAFFEINE 50-325-40 MG PO TABS: 1.0000 | ORAL_TABLET | Freq: Every day | ORAL | 0 refills | Status: DC | PRN

## 2021-10-12 MED ORDER — ALPRAZOLAM 0.5 MG PO TABS: 0.5000 mg | ORAL_TABLET | Freq: Every evening | ORAL | 0 refills | Status: DC | PRN

## 2021-10-14 NOTE — Telephone Encounter (Signed)
Requested medication (s) are due for refill today: yes ? ?Requested medication (s) are on the active medication list: yes   ? ?Last refill: 09/07/21  #20  0 refills ? ?Future visit scheduled yes 11/12/21 ? ?Notes to clinic:Not delegated, please review. Thank you. ? ?Requested Prescriptions  ?Pending Prescriptions Disp Refills  ? ALPRAZolam (XANAX) 0.5 MG tablet [Pharmacy Med Name: ALPRAZOLAM 0.5MG  TABLETS] 20 tablet   ?  Sig: TAKE 1 TABLET(0.5 MG) BY MOUTH AT BEDTIME AS NEEDED FOR ANXIETY  ?  ? Not Delegated - Psychiatry: Anxiolytics/Hypnotics 2 Failed - 10/12/2021  4:06 PM  ?  ?  Failed - This refill cannot be delegated  ?  ?  Failed - Urine Drug Screen completed in last 360 days  ?  ?  Passed - Patient is not pregnant  ?  ?  Passed - Valid encounter within last 6 months  ?  Recent Outpatient Visits   ? ?      ? 1 month ago Non-recurrent acute suppurative otitis media of left ear without spontaneous rupture of tympanic membrane  ? [facility] [name], MD  ? 1 month ago BMI 39.0-39.9,adult  ? [name], [facility]  ? 5 months ago Need for immunization against influenza  ? [facility] [name], MD  ? 11 months ago Annual physical exam  ? [facility] [name], MD  ? 1 year ago Acute non intractable tension-type headache  ? [facility] [name], MD  ? ?  ?  ?Future Appointments   ? ?        ? In 4 weeks [name], MD [facility], [facility]  ? ?  ? ? ?  ?  ?  ? ? ? ? ?

## 2021-10-24 ENCOUNTER — Other Ambulatory Visit: Payer: Self-pay | Admitting: Internal Medicine

## 2021-10-24 DIAGNOSIS — F5101 Primary insomnia: Secondary | ICD-10-CM

## 2021-10-27 ENCOUNTER — Other Ambulatory Visit: Payer: Self-pay | Admitting: Internal Medicine

## 2021-10-27 ENCOUNTER — Encounter: Payer: Self-pay | Admitting: Internal Medicine

## 2021-10-27 NOTE — Telephone Encounter (Signed)
Requested medication (s) are due for refill today: Yes  Requested medication (s) are on the active medication list:Yes  Last refill:  07/06/21  Future visit scheduled: Yes  Notes to clinic:  Unable to refill per protocol, cannot delegate.      Requested Prescriptions  Pending Prescriptions Disp Refills   zolpidem (AMBIEN CR) 12.5 MG CR tablet [Pharmacy Med Name: ZOLPIDEM ER 12.5MG  TABLETS] 30 tablet     Sig: TAKE 1 TABLET(12.5 MG) BY MOUTH AT BEDTIME AS NEEDED FOR SLEEP     Not Delegated - Psychiatry:  Anxiolytics/Hypnotics Failed - 10/24/2021 11:25 PM      Failed - This refill cannot be delegated      Failed - Urine Drug Screen completed in last 360 days      Passed - Valid encounter within last 6 months    Recent Outpatient Visits           1 month ago Non-recurrent acute suppurative otitis media of left ear without spontaneous rupture of tympanic membrane   [facility]an [facility], [facility]   2 months ago BMI 39.0-39.9,adult   [facility] Glean Hess, MD   5 months ago Need for immunization against influenza   [facility] Glean Hess, MD   11 months ago Annual physical exam   [facility] Glean Hess, MD   1 year ago Acute non intractable tension-type headache   [facility] Glean Hess, MD       Future Appointments             In 2 weeks Army Melia Jesse Sans, MD [facility], [facility]

## 2021-11-12 ENCOUNTER — Encounter: Payer: BC Managed Care – PPO | Admitting: Internal Medicine

## 2021-11-24 ENCOUNTER — Other Ambulatory Visit: Payer: Self-pay | Admitting: Internal Medicine

## 2021-11-24 DIAGNOSIS — F5101 Primary insomnia: Secondary | ICD-10-CM

## 2021-11-25 MED ORDER — ZOLPIDEM TARTRATE ER 12.5 MG PO TBCR: 12.5000 mg | EXTENDED_RELEASE_TABLET | Freq: Every evening | ORAL | 1 refills | Status: DC | PRN

## 2021-11-25 NOTE — Telephone Encounter (Signed)
Requested medication (s) are due for refill today:   Provider to review  Requested medication (s) are on the active medication list:   Yes  Future visit scheduled:   No   Last ordered: 11/02/2021 #30, 0 refills  Non delegated refill   Requested Prescriptions  Pending Prescriptions Disp Refills   zolpidem (AMBIEN CR) 12.5 MG CR tablet [Pharmacy Med Name: ZOLPIDEM ER 12.5MG  TABLETS] 30 tablet     Sig: TAKE 1 TABLET(12.5 MG) BY MOUTH AT BEDTIME AS NEEDED FOR SLEEP     Not Delegated - Psychiatry:  Anxiolytics/Hypnotics Failed - 11/24/2021  9:19 PM      Failed - This refill cannot be delegated      Failed - Urine Drug Screen completed in last 360 days      Passed - Valid encounter within last 6 months    Recent Outpatient Visits           2 months ago Non-recurrent acute suppurative otitis media of left ear without spontaneous rupture of tympanic membrane   [facility] [name], MD   3 months ago BMI 39.0-39.9,adult   [facility] [name], MD   6 months ago Need for immunization against influenza   [facility] [name], MD   1 year ago Annual physical exam   [facility] [name], MD   1 year ago Acute non intractable tension-type headache   [facility] [name], MD

## 2021-12-07 ENCOUNTER — Encounter: Payer: Self-pay | Admitting: Internal Medicine

## 2021-12-07 ENCOUNTER — Other Ambulatory Visit: Payer: Self-pay | Admitting: Internal Medicine

## 2021-12-07 DIAGNOSIS — G44209 Tension-type headache, unspecified, not intractable: Secondary | ICD-10-CM

## 2021-12-07 DIAGNOSIS — F419 Anxiety disorder, unspecified: Secondary | ICD-10-CM

## 2021-12-08 MED ORDER — ALPRAZOLAM 0.5 MG PO TABS: 0.5000 mg | ORAL_TABLET | Freq: Every evening | ORAL | 0 refills | Status: DC | PRN

## 2021-12-08 MED ORDER — BUTALBITAL-APAP-CAFFEINE 50-325-40 MG PO TABS: 1.0000 | ORAL_TABLET | Freq: Every day | ORAL | 0 refills | Status: DC | PRN

## 2021-12-11 ENCOUNTER — Encounter: Payer: Self-pay | Admitting: Internal Medicine

## 2022-01-11 ENCOUNTER — Encounter: Payer: Self-pay | Admitting: Internal Medicine

## 2022-01-11 ENCOUNTER — Other Ambulatory Visit: Payer: Self-pay | Admitting: Internal Medicine

## 2022-01-11 DIAGNOSIS — F5101 Primary insomnia: Secondary | ICD-10-CM

## 2022-01-11 MED ORDER — ZOLPIDEM TARTRATE 10 MG PO TABS: 10.0000 mg | ORAL_TABLET | Freq: Every evening | ORAL | 1 refills | Status: DC | PRN

## 2022-01-11 NOTE — Telephone Encounter (Signed)
Please review.  KP

## 2022-01-29 ENCOUNTER — Other Ambulatory Visit: Payer: Self-pay | Admitting: Internal Medicine

## 2022-01-29 ENCOUNTER — Encounter: Payer: Self-pay | Admitting: Internal Medicine

## 2022-01-29 DIAGNOSIS — Z6839 Body mass index (BMI) 39.0-39.9, adult: Secondary | ICD-10-CM

## 2022-02-02 ENCOUNTER — Encounter: Payer: Self-pay | Admitting: Internal Medicine

## 2022-02-02 ENCOUNTER — Telehealth: Payer: Self-pay

## 2022-02-02 ENCOUNTER — Ambulatory Visit (INDEPENDENT_AMBULATORY_CARE_PROVIDER_SITE_OTHER): Payer: BC Managed Care – PPO | Admitting: Internal Medicine

## 2022-02-02 VITALS — BP 112/78 | HR 69 | Ht 63.0 in | Wt 226.0 lb

## 2022-02-02 DIAGNOSIS — Z6838 Body mass index (BMI) 38.0-38.9, adult: Secondary | ICD-10-CM | POA: Diagnosis not present

## 2022-02-02 DIAGNOSIS — F5101 Primary insomnia: Secondary | ICD-10-CM | POA: Diagnosis not present

## 2022-02-02 MED ORDER — WEGOVY 0.25 MG/0.5ML ~~LOC~~ SOAJ: 0.2500 mg | SUBCUTANEOUS | 0 refills | Status: DC

## 2022-02-02 NOTE — Telephone Encounter (Signed)
PA completed waiting on insurance approval.  Key: Christina Carlson  KP

## 2022-02-02 NOTE — Progress Notes (Signed)
Date:  02/02/2022   Name:  Christina Carlson   DOB:  June 16, 1975   MRN:  [id number]   Chief Complaint: Obesity (Wants to start back on Wegovy. Patient has new insurance.)  Weight management - pt is here for weight management follow up.  Started on Wegovy sample but insurance did not cover..  Did well without side effects.  Starting weight 226 lbs.   Today's weight 226 lbs.   Insomnia The problem occurs every several days. The problem is unchanged. Past treatments include medication. The treatment provided significant (she is trying not to take the ambien every night so her husband has control of it.) relief.  Migraine  This is a recurrent problem. The problem occurs intermittently. The problem has been unchanged. The pain quality is similar to prior headaches. Associated symptoms include insomnia. Pertinent negatives include no abdominal pain, coughing, dizziness or weakness.    Lab Results  Component Value Date   NA 138 11/10/2020   K 4.4 11/10/2020   CO2 20 11/10/2020   GLUCOSE 73 11/10/2020   BUN 9 11/10/2020   CREATININE 0.79 11/10/2020   CALCIUM 9.0 11/10/2020   EGFR 93 11/10/2020   GFRNONAA 87 10/05/2018   Lab Results  Component Value Date   CHOL 197 11/10/2020   HDL 54 11/10/2020   LDLCALC 125 (H) 11/10/2020   TRIG 98 11/10/2020   CHOLHDL 3.6 11/10/2020   Lab Results  Component Value Date   TSH 2.510 11/10/2020   Lab Results  Component Value Date   HGBA1C 5.3 09/14/2017   Lab Results  Component Value Date   WBC 6.6 11/10/2020   HGB 11.2 11/10/2020   HCT 35.0 11/10/2020   MCV 86 11/10/2020   PLT 343 11/10/2020   Lab Results  Component Value Date   ALT 6 11/10/2020   AST 9 11/10/2020   ALKPHOS 100 11/10/2020   BILITOT <0.2 11/10/2020   Lab Results  Component Value Date   VD25OH 43.1 11/26/2015     Review of Systems  Constitutional:  Negative for fatigue and unexpected weight change.  HENT:  Negative for nosebleeds.   Eyes:  Negative for visual  disturbance.  Respiratory:  Negative for cough, chest tightness, shortness of breath and wheezing.   Cardiovascular:  Negative for chest pain, palpitations and leg swelling.  Gastrointestinal:  Negative for abdominal pain, constipation and diarrhea.  Neurological:  Negative for dizziness, weakness, light-headedness and headaches.  Psychiatric/Behavioral:  The patient has insomnia.     Patient Active Problem List   Diagnosis Date Noted   Colon cancer screening    Polyp of transverse colon    Atypical squamous cells of undetermined significance on cytologic smear of cervix (ASC-US) 06/27/2015   Vitamin B12 nutritional deficiency 05/07/2015   LBP (low back pain) 09/16/2014   Bariatric surgery status 09/16/2014   Gastro-esophageal reflux disease without esophagitis 09/16/2014   H/O motion sickness 09/16/2014   Migraine without aura and responsive to treatment 09/16/2014   Idiopathic insomnia 09/16/2014    Allergies  Allergen Reactions   Sulfa Antibiotics Hives   Oxycodone Itching   Tramadol Palpitations    Past Surgical History:  Procedure Laterality Date   BREAST BIOPSY Right 2001   neg   COLONOSCOPY WITH PROPOFOL N/A 01/16/2021   Procedure: COLONOSCOPY WITH PROPOFOL;  Surgeon: Lucilla Lame, MD;  Location: [facility];  Service: Endoscopy;  Laterality: N/A;   INGUINAL HERNIA REPAIR  1981   POLYPECTOMY  01/16/2021   Procedure: POLYPECTOMY;  Surgeon: Lucilla Lame, MD;  Location: [facility];  Service: Endoscopy;;   ROUX-EN-Y GASTRIC BYPASS  2007   TONSILLECTOMY     VAGINAL HYSTERECTOMY      Social History   Tobacco Use   Smoking status: Never   Smokeless tobacco: Never  Vaping Use   Vaping Use: Never used  Substance Use Topics   Alcohol use: Yes   Drug use: No     Medication list has been reviewed and updated.  Current Meds  Medication Sig   ALPRAZolam (XANAX) 0.5 MG tablet Take 1 tablet (0.5 mg total) by mouth at bedtime as needed for anxiety.    butalbital-acetaminophen-caffeine (FIORICET) 50-325-40 MG tablet Take 1 tablet by mouth daily as needed for headache.   cyanocobalamin (,VITAMIN B-12,) 1000 MCG/ML injection Inject 1 mL (1,000 mcg total) into the muscle every 14 (fourteen) days.   zolpidem (AMBIEN) 10 MG tablet Take 1 tablet (10 mg total) by mouth at bedtime as needed for sleep.       02/02/2022    3:01 PM 08/17/2021    2:20 PM 05/15/2021    9:34 AM 11/10/2020   10:42 AM  GAD 7 : Generalized Anxiety Score  Nervous, Anxious, on Edge 0 0 0 0  Control/stop worrying _0 0  Worry too much - different things 1 0 1 0  Trouble relaxing _1 Restless 0 0 0 0  Easily annoyed or irritable _2 0  Afraid - awful might happen 0 0 0 0  Total GAD 7 Score _3 Anxiety Difficulty Somewhat difficult Not difficult at all         02/02/2022    3:01 PM 08/17/2021    2:20 PM 05/15/2021    9:34 AM  Depression screen PHQ 2/9  Decreased Interest 1 0 0  Down, Depressed, Hopeless 2 1 0  PHQ - 2 Score 3 1 0  Altered sleeping 1 0 0  Tired, decreased energy 1 2 0  Change in appetite 0 0 0  Feeling bad or failure about yourself  0 0 0  Trouble concentrating 0 0 0  Moving slowly or fidgety/restless 0 0 0  Suicidal thoughts 0 0 0  PHQ-9 Score 5 3 0  Difficult doing work/chores Not difficult at all Not difficult at all Not difficult at all    BP Readings from Last 3 Encounters:  02/02/22 112/78  10/06/21 (!) 129/91  08/31/21 112/70    Physical Exam Vitals and nursing note reviewed.  Constitutional:      General: She is not in acute distress.    Appearance: Normal appearance. She is well-developed.  HENT:     Head: Normocephalic and atraumatic.  Cardiovascular:     Rate and Rhythm: Normal rate and regular rhythm.     Pulses: Normal pulses.     Heart sounds: No murmur heard. Pulmonary:     Effort: Pulmonary effort is normal. No respiratory distress.     Breath sounds: No wheezing or rhonchi.  Musculoskeletal:      Cervical back: Normal range of motion.     Right lower leg: No edema.     Left lower leg: No edema.  Skin:    General: Skin is warm and dry.     Findings: No rash.  Neurological:     Mental Status: She is alert and oriented to person, place, and time.  Psychiatric:        Mood  and Affect: Mood normal.        Behavior: Behavior normal.     Wt Readings from Last 3 Encounters:  02/02/22 226 lb (102.5 kg)  10/06/21 224 lb (101.6 kg)  08/31/21 226 lb (102.5 kg)    BP 112/78   Pulse 69   Ht _0  (1.6 m)   Wt 226 lb (102.5 kg)   LMP 02/14/2019 (Exact Date)   SpO2 98%   BMI 40.03 kg/m   Assessment and Plan: 1. Idiopathic insomnia Doing fairly well with PRN Ambien at this time.  2. BMI 38.0-38.9,adult S/p gastric bypass  Needs additional weight loss - new insurance may cover so will send in a new Rx - Semaglutide-Weight Management (WEGOVY) 0.25 MG/0.5ML SOAJ; Inject 0.25 mg into the skin once a week.  Dispense: 2 mL; Refill: 0  Recommend scheduling a CPX with fasting labs in the near future.  Partially dictated using Editor, commissioning. Any errors are unintentional.  Halina Maidens, MD [facility]  02/02/2022

## 2022-02-02 NOTE — Telephone Encounter (Signed)
PA completed waiting on insurance approval.  Key: [id number]  [id number]

## 2022-02-02 NOTE — Telephone Encounter (Signed)
Error- pt had to start over with 0.25 MG  KP

## 2022-02-02 NOTE — Telephone Encounter (Signed)
D/C 10/06/21. Requested Prescriptions  Refused Prescriptions Disp Refills  . WEGOVY 0.5 MG/0.5ML SOAJ [Pharmacy Med Name: WEGOVY 0.5MG /0.5ML PF PEN INJ] 2 mL 0    Sig: INJECT 0.5 MG UNDER THE SKIN ONCE A WEEK     Endocrinology:  Diabetes - GLP-1 Receptor Agonists - semaglutide Failed - 01/29/2022  2:51 PM      Failed - HBA1C in normal range and within 180 days    Hgb A1c MFr Bld  Date Value Ref Range Status  09/14/2017 5.3 4.8 - 5.6 % Final    Comment:             Prediabetes: 5.7 - 6.4          Diabetes: >6.4          Glycemic control for adults with diabetes: <7.0          Failed - Cr in normal range and within 360 days    Creatinine, Ser  Date Value Ref Range Status  11/10/2020 0.79 0.57 - 1.00 mg/dL Final         Passed - Valid encounter within last 6 months    Recent Outpatient Visits          5 months ago Non-recurrent acute suppurative otitis media of left ear without spontaneous rupture of tympanic membrane   [place] [facility] at [facility], Nyoka Cowden, MD   5 months ago BMI 39.0-39.9,adult   [place] [facility] Medicine at [facility], Nyoka Cowden, MD   8 months ago Need for immunization against influenza   [facility] Primary Care and Sports Medicine at [facility], Nyoka Cowden, MD   1 year ago Annual physical exam   [facility] Primary Care and Sports Medicine at [facility], Nyoka Cowden, MD   2 years ago Acute non intractable tension-type headache   [place] [facility] at [facility], Nyoka Cowden, MD      Future Appointments            Today [name], MD [facility] Health Primary Care and Sports Medicine at [facility], [facility]   In 3 weeks Judithann Graves, Nyoka Cowden, MD [facility] Health Primary Care and Sports Medicine at [facility], [facility]

## 2022-02-02 NOTE — Telephone Encounter (Signed)
Error.     KP 

## 2022-02-02 NOTE — Telephone Encounter (Signed)
med d/c'd 10/06/21 "completed course" Dr Ria Clock  Requested Prescriptions  Refused Prescriptions Disp Refills  . WEGOVY 0.5 MG/0.5ML SOAJ [Pharmacy Med Name: WEGOVY 0.5MG /0.5ML PF PEN INJ] 2 mL 0    Sig: INJECT 0.5 MG UNDER THE SKIN ONCE A WEEK     Endocrinology:  Diabetes - GLP-1 Receptor Agonists - semaglutide Failed - 01/29/2022  5:49 PM      Failed - HBA1C in normal range and within 180 days    Hgb A1c MFr Bld  Date Value Ref Range Status  09/14/2017 5.3 4.8 - 5.6 % Final    Comment:             Prediabetes: 5.7 - 6.4          Diabetes: >6.4          Glycemic control for adults with diabetes: <7.0          Failed - Cr in normal range and within 360 days    Creatinine, Ser  Date Value Ref Range Status  11/10/2020 0.79 0.57 - 1.00 mg/dL Final         Passed - Valid encounter within last 6 months    Recent Outpatient Visits          5 months ago Non-recurrent acute suppurative otitis media of left ear without spontaneous rupture of tympanic membrane   [place] [facility] at [facility], Nyoka Cowden, MD   5 months ago BMI 39.0-39.9,adult   [place] [facility] at [facility], Nyoka Cowden, MD   8 months ago Need for immunization against influenza   [facility] Primary Care and [facility] Medicine at [facility], Nyoka Cowden, MD   1 year ago Annual physical exam   [facility] Primary Care and Sports [facility] at [facility], Nyoka Cowden, MD   2 years ago Acute non intractable tension-type headache   [place] [facility] at [facility], Nyoka Cowden, MD      Future Appointments            Today [name], MD [facility] Primary Care and Sports Medicine at [facility], [facility]   In 3 weeks Judithann Graves, Nyoka Cowden, MD [facility] Primary Care and Sports Medicine at [facility], [facility]

## 2022-02-03 ENCOUNTER — Telehealth: Payer: Self-pay

## 2022-02-03 NOTE — Telephone Encounter (Signed)
PA approved 02/02/22-09/03/22  KP

## 2022-02-10 ENCOUNTER — Other Ambulatory Visit: Payer: Self-pay | Admitting: Internal Medicine

## 2022-02-10 DIAGNOSIS — F419 Anxiety disorder, unspecified: Secondary | ICD-10-CM

## 2022-02-10 DIAGNOSIS — G44209 Tension-type headache, unspecified, not intractable: Secondary | ICD-10-CM

## 2022-02-10 MED ORDER — BUTALBITAL-APAP-CAFFEINE 50-325-40 MG PO TABS: 1.0000 | ORAL_TABLET | Freq: Every day | ORAL | 0 refills | Status: DC | PRN

## 2022-02-10 NOTE — Telephone Encounter (Signed)
Please review.  KP

## 2022-02-11 NOTE — Telephone Encounter (Signed)
Requested medication (s) are due for refill today: no  Requested medication (s) are on the active medication list: no    Last refill: 12/08/21  #30  0 refills  Future visit scheduled yes 03/19/22  Notes to clinic:  Discontinued 02/10/22 . Cannot refuse non delegated meds per protocol.  Requested Prescriptions  Pending Prescriptions Disp Refills   ALPRAZolam (XANAX) 0.5 MG tablet [Pharmacy Med Name: ALPRAZOLAM 0.5MG  TABLETS] 30 tablet     Sig: TAKE 1 TABLET(0.5 MG) BY MOUTH AT BEDTIME AS NEEDED FOR ANXIETY     Not Delegated - Psychiatry: Anxiolytics/Hypnotics 2 Failed - 02/10/2022  1:45 AM      Failed - This refill cannot be delegated      Failed - Urine Drug Screen completed in last 360 days      Passed - Patient is not pregnant      Passed - Valid encounter within last 6 months    Recent Outpatient Visits           1 week ago Idiopathic insomnia   [place] [facility] at [facility], Nyoka Cowden, MD   5 months ago Non-recurrent acute suppurative otitis media of left ear without spontaneous rupture of tympanic membrane   [place] [facility] Medicine at [facility], Nyoka Cowden, MD   5 months ago BMI 39.0-39.9,adult    Primary Care and Sports Medicine at [facility], Nyoka Cowden, MD   9 months ago Need for immunization against influenza   [facility] Primary [facility] and [facility] Medicine at [facility], Nyoka Cowden, MD   1 year ago Annual physical exam   [facility] Health Primary Care and Sports Medicine at [facility], Nyoka Cowden, MD       Future Appointments             In 1 month Judithann Graves, Nyoka Cowden, MD [facility] Health Primary Care and Sports Medicine at [facility], [facility]   In 4 months Judithann Graves, Nyoka Cowden, MD [facility] Health Primary Care and Sports Medicine at [facility], [facility]

## 2022-02-15 ENCOUNTER — Other Ambulatory Visit: Payer: Self-pay | Admitting: Internal Medicine

## 2022-02-15 ENCOUNTER — Encounter: Payer: Self-pay | Admitting: Internal Medicine

## 2022-02-15 DIAGNOSIS — Z6838 Body mass index (BMI) 38.0-38.9, adult: Secondary | ICD-10-CM

## 2022-02-15 MED ORDER — WEGOVY 0.25 MG/0.5ML ~~LOC~~ SOAJ: 0.2500 mg | SUBCUTANEOUS | 0 refills | Status: DC

## 2022-02-17 ENCOUNTER — Encounter: Payer: Self-pay | Admitting: Internal Medicine

## 2022-02-19 ENCOUNTER — Ambulatory Visit: Payer: BC Managed Care – PPO | Admitting: Internal Medicine

## 2022-02-24 ENCOUNTER — Ambulatory Visit: Payer: BC Managed Care – PPO | Admitting: Internal Medicine

## 2022-02-25 ENCOUNTER — Other Ambulatory Visit: Payer: Self-pay

## 2022-02-25 ENCOUNTER — Encounter: Payer: Self-pay | Admitting: Internal Medicine

## 2022-02-25 DIAGNOSIS — Z6838 Body mass index (BMI) 38.0-38.9, adult: Secondary | ICD-10-CM

## 2022-02-25 MED ORDER — WEGOVY 0.5 MG/0.5ML ~~LOC~~ SOAJ: 0.5000 mg | SUBCUTANEOUS | 0 refills | Status: DC

## 2022-02-26 ENCOUNTER — Encounter: Payer: Self-pay | Admitting: Internal Medicine

## 2022-02-26 NOTE — Telephone Encounter (Signed)
Please review.  KP

## 2022-03-03 ENCOUNTER — Ambulatory Visit: Payer: BC Managed Care – PPO | Admitting: Internal Medicine

## 2022-03-04 ENCOUNTER — Other Ambulatory Visit: Payer: Self-pay | Admitting: Internal Medicine

## 2022-03-04 DIAGNOSIS — Z6838 Body mass index (BMI) 38.0-38.9, adult: Secondary | ICD-10-CM

## 2022-03-05 NOTE — Telephone Encounter (Signed)
Requested medication (s) are due for refill today: No  Requested medication (s) are on the active medication list: Yes  Last refill:  02/25/22  Future visit scheduled: Yes  Notes to clinic:  Pharmacy reports back ordered, see request.    Requested Prescriptions  Pending Prescriptions Disp Refills   WEGOVY 0.5 MG/0.5ML SOAJ [Pharmacy Med Name: WEGOVY (4) INJ 0.5MG ]      Sig: [place]     Endocrinology:  Diabetes - GLP-1 Receptor Agonists - semaglutide Failed - 03/04/2022  2:27 PM      Failed - HBA1C in normal range and within 180 days    Hgb A1c MFr Bld  Date Value Ref Range Status  09/14/2017 5.3 4.8 - 5.6 % Final    Comment:             Prediabetes: 5.7 - 6.4          Diabetes: >6.4          Glycemic control for adults with diabetes: <7.0          Failed - Cr in normal range and within 360 days    Creatinine, Ser  Date Value Ref Range Status  11/10/2020 0.79 0.57 - 1.00 mg/dL Final         Passed - Valid encounter within last 6 months    Recent Outpatient Visits           1 month ago Idiopathic insomnia   [place] [facility] Medicine at [facility], Jesse Sans, MD   6 months ago Non-recurrent acute suppurative otitis media of left ear without spontaneous rupture of tympanic membrane   [place] [facility] Care [facility] Sports Medicine at [facility], Jesse Sans, MD   6 months ago BMI 39.0-39.9,adult   [place] [facility] Medicine at [facility], Jesse Sans, MD   9 months ago Need for immunization against influenza   [facility] Primary Care and Sports Medicine at [facility], Jesse Sans, MD   1 year ago Annual physical exam   [facility] Health Primary Care and Sports Medicine at [facility], Jesse Sans, MD       Future Appointments             In 2 weeks Army Melia, Jesse Sans, MD [facility] Health Primary Care and Sports Medicine at [facility], [facility]   In 3  months Army Melia, Jesse Sans, MD [place] [facility] Care and Sports Medicine at [facility], [facility]

## 2022-03-08 ENCOUNTER — Telehealth: Payer: Self-pay | Admitting: Internal Medicine

## 2022-03-08 NOTE — Telephone Encounter (Signed)
Copied from [place] [phone]. Topic: General - Other >> Mar 08, 2022  9:46 AM [place] B wrote: Reason for CRM: Jiles Garter from [employer] called in about Wegovy 0.5. I just saw where a message was left for patient that nothing can be done for backorders. She has to take what she has left until off backorder. Ref # for caller. [id number]

## 2022-03-08 NOTE — Telephone Encounter (Deleted)
Jiles Garter called in from [employer], they are out of the [facility] 0.5 needs to know where else to get this. EXB#[id number]

## 2022-03-08 NOTE — Telephone Encounter (Signed)
Medication on backorder. Patient aware.

## 2022-03-17 ENCOUNTER — Other Ambulatory Visit: Payer: Self-pay | Admitting: Internal Medicine

## 2022-03-17 ENCOUNTER — Encounter: Payer: Self-pay | Admitting: Internal Medicine

## 2022-03-17 DIAGNOSIS — F5101 Primary insomnia: Secondary | ICD-10-CM

## 2022-03-17 DIAGNOSIS — G44209 Tension-type headache, unspecified, not intractable: Secondary | ICD-10-CM

## 2022-03-17 MED ORDER — BUTALBITAL-APAP-CAFFEINE 50-325-40 MG PO TABS: 1.0000 | ORAL_TABLET | Freq: Every day | ORAL | 0 refills | Status: DC | PRN

## 2022-03-17 MED ORDER — ZOLPIDEM TARTRATE 10 MG PO TABS: 10.0000 mg | ORAL_TABLET | Freq: Every evening | ORAL | 1 refills | Status: DC | PRN

## 2022-03-17 NOTE — Telephone Encounter (Signed)
Please review.  KP

## 2022-03-19 ENCOUNTER — Other Ambulatory Visit: Payer: Self-pay | Admitting: Internal Medicine

## 2022-03-19 ENCOUNTER — Ambulatory Visit: Payer: BC Managed Care – PPO | Admitting: Internal Medicine

## 2022-03-19 DIAGNOSIS — F5101 Primary insomnia: Secondary | ICD-10-CM

## 2022-03-19 MED ORDER — ZOLPIDEM TARTRATE 10 MG PO TABS: 10.0000 mg | ORAL_TABLET | Freq: Every evening | ORAL | 0 refills | Status: DC | PRN

## 2022-03-19 NOTE — Telephone Encounter (Signed)
Pts response.  KP

## 2022-03-19 NOTE — Telephone Encounter (Signed)
Please review.  KP

## 2022-04-01 ENCOUNTER — Other Ambulatory Visit: Payer: Self-pay

## 2022-04-01 DIAGNOSIS — Z6838 Body mass index (BMI) 38.0-38.9, adult: Secondary | ICD-10-CM

## 2022-04-01 MED ORDER — WEGOVY 0.25 MG/0.5ML ~~LOC~~ SOAJ: 0.2500 mg | SUBCUTANEOUS | 0 refills | Status: DC

## 2022-04-14 ENCOUNTER — Other Ambulatory Visit: Payer: Self-pay | Admitting: Internal Medicine

## 2022-04-14 DIAGNOSIS — F419 Anxiety disorder, unspecified: Secondary | ICD-10-CM

## 2022-04-15 NOTE — Telephone Encounter (Signed)
Unable to refill per protocol, Rx expired. Medication was discontinued 12/08/21 by PCP. Will refuse request.  Requested Prescriptions  Pending Prescriptions Disp Refills   ALPRAZolam (XANAX) 0.5 MG tablet [Pharmacy Med Name: ALPRAZOLAM 0.5MG  TABLETS] 20 tablet     Sig: TAKE 1 TABLET(0.5 MG) BY MOUTH AT BEDTIME AS NEEDED FOR ANXIETY     Not Delegated - Psychiatry: Anxiolytics/Hypnotics 2 Failed - 04/14/2022  5:15 PM      Failed - This refill cannot be delegated      Failed - Urine Drug Screen completed in last 360 days      Passed - Patient is not pregnant      Passed - Valid encounter within last 6 months    Recent Outpatient Visits           2 months ago Idiopathic insomnia   [place] [facility] Medicine at [facility], Nyoka Cowden, MD   7 months ago Non-recurrent acute suppurative otitis media of left ear without spontaneous rupture of tympanic membrane   [place] [facility] Medicine at [facility], Nyoka Cowden, MD   8 months ago BMI 39.0-39.9,adult   [facility] and [facility] at [facility], Nyoka Cowden, MD   11 months ago Need for immunization against influenza   [facility] Primary Care and Sports Medicine at [facility], Nyoka Cowden, MD   1 year ago Annual physical exam   [facility] Health Primary Care and Sports Medicine at [facility], Nyoka Cowden, MD       Future Appointments             In 2 months Judithann Graves, Nyoka Cowden, MD [facility] Health Primary Care and Sports Medicine at [facility], [facility]

## 2022-05-04 ENCOUNTER — Other Ambulatory Visit: Payer: Self-pay | Admitting: Internal Medicine

## 2022-05-04 DIAGNOSIS — F5101 Primary insomnia: Secondary | ICD-10-CM

## 2022-05-04 NOTE — Telephone Encounter (Signed)
Requested medications are due for refill today.  yes  Requested medications are on the active medications list.  yes  Last refill. 03/19/2022 #30 0 rf  Future visit scheduled.   yes  Notes to clinic.  Refill not delegated.    Requested Prescriptions  Pending Prescriptions Disp Refills   zolpidem (AMBIEN) 10 MG tablet [Pharmacy Med Name: ZOLPIDEM 10MG  TABLETS] 30 tablet     Sig: TAKE 1 TABLET(10 MG) BY MOUTH AT BEDTIME AS NEEDED FOR SLEEP     Not Delegated - Psychiatry:  Anxiolytics/Hypnotics Failed - 05/04/2022  2:31 PM      Failed - This refill cannot be delegated      Failed - Urine Drug Screen completed in last 360 days      Passed - Valid encounter within last 6 months    Recent Outpatient Visits           3 months ago Idiopathic insomnia   [place] [facility] at [facility], [facility], MD   8 months ago Non-recurrent acute suppurative otitis media of left ear without spontaneous rupture of tympanic membrane   [place] [facility] at [facility], [facility], MD   8 months ago BMI 39.0-39.9,adult   [facility] at [facility], [facility], MD   11 months ago Need for immunization against influenza   [facility] at [facility], [facility], MD   1 year ago Annual physical exam   [facility] at [facility], [facility], MD       Future Appointments             In 1 month Nyoka Cowden, Judithann Graves, MD [facility] and Sports Medicine at [facility], [facility]

## 2022-05-04 NOTE — Telephone Encounter (Signed)
Please review. Last office visit 02/02/2022.  KP

## 2022-05-06 ENCOUNTER — Encounter: Payer: Self-pay | Admitting: Internal Medicine

## 2022-05-06 ENCOUNTER — Other Ambulatory Visit: Payer: Self-pay | Admitting: Internal Medicine

## 2022-05-06 DIAGNOSIS — G44209 Tension-type headache, unspecified, not intractable: Secondary | ICD-10-CM

## 2022-05-06 NOTE — Telephone Encounter (Signed)
Please review. Last office visit 02/02/2022.  KP

## 2022-05-06 NOTE — Telephone Encounter (Signed)
Requested medications are due for refill today.  yes  Requested medications are on the active medications list.  yes  Last refill. 03/17/2022 #20 0 rf  Future visit scheduled.   yes  Notes to clinic.  Refill not delegated.    Requested Prescriptions  Pending Prescriptions Disp Refills   butalbital-acetaminophen-caffeine (FIORICET) 50-325-40 MG tablet [Pharmacy Med Name: BUT/APAP/CAF TAB 325MG] 20 tablet 0    Sig: TAKE 1 TABLET DAILY AS     NEEDED FOR HEADACHE     Not Delegated - Analgesics:  Non-Opioid Analgesic Combinations 2 Failed - 05/06/2022 10:36 AM      Failed - This refill cannot be delegated      Failed - Cr in normal range and within 360 days    Creatinine, Ser  Date Value Ref Range Status  11/10/2020 0.79 0.57 - 1.00 mg/dL Final         Failed - eGFR is 10 or above and within 360 days    GFR calc Af Amer  Date Value Ref Range Status  10/05/2018 101 >59 mL/min/1.73 Final   GFR calc non Af Amer  Date Value Ref Range Status  10/05/2018 87 >59 mL/min/1.73 Final   eGFR  Date Value Ref Range Status  11/10/2020 93 >59 mL/min/1.73 Final         Passed - Patient is not pregnant      Passed - Valid encounter within last 12 months    Recent Outpatient Visits           3 months ago Idiopathic insomnia   [place] [facility] at [facility], Jesse Sans, MD   8 months ago Non-recurrent acute suppurative otitis media of left ear without spontaneous rupture of tympanic membrane   [place] [facility] at [facility], Jesse Sans, MD   8 months ago BMI 39.0-39.9,adult   [facility] and Sports Medicine at [facility], Jesse Sans, MD   11 months ago Need for immunization against influenza   [facility] and [facility] Medicine at [facility], Jesse Sans, MD   1 year ago Annual physical exam   [facility] Primary Care and Sports Medicine at [facility], Jesse Sans, MD       Future Appointments             In 1 month Army Melia, Jesse Sans, MD [place] [facility] at [facility], [facility]

## 2022-06-03 ENCOUNTER — Other Ambulatory Visit: Payer: Self-pay

## 2022-06-03 ENCOUNTER — Encounter: Payer: Self-pay | Admitting: Internal Medicine

## 2022-06-03 DIAGNOSIS — Z6838 Body mass index (BMI) 38.0-38.9, adult: Secondary | ICD-10-CM

## 2022-06-03 MED ORDER — WEGOVY 0.5 MG/0.5ML ~~LOC~~ SOAJ: 0.5000 mg | SUBCUTANEOUS | 0 refills | Status: DC

## 2022-06-04 ENCOUNTER — Other Ambulatory Visit: Payer: Self-pay

## 2022-06-04 MED ORDER — WEGOVY 1 MG/0.5ML ~~LOC~~ SOAJ: 1.0000 mg | SUBCUTANEOUS | 0 refills | Status: DC

## 2022-06-17 ENCOUNTER — Encounter: Payer: BC Managed Care – PPO | Admitting: Internal Medicine

## 2022-06-23 ENCOUNTER — Other Ambulatory Visit: Payer: Self-pay | Admitting: Internal Medicine

## 2022-06-23 DIAGNOSIS — G44209 Tension-type headache, unspecified, not intractable: Secondary | ICD-10-CM

## 2022-06-24 ENCOUNTER — Encounter: Payer: Self-pay | Admitting: Internal Medicine

## 2022-06-24 ENCOUNTER — Other Ambulatory Visit: Payer: Self-pay

## 2022-06-24 MED ORDER — WEGOVY 1.7 MG/0.75ML ~~LOC~~ SOAJ: 1.7000 mg | SUBCUTANEOUS | 0 refills | Status: DC

## 2022-06-30 ENCOUNTER — Encounter: Payer: Self-pay | Admitting: Internal Medicine

## 2022-06-30 ENCOUNTER — Ambulatory Visit (INDEPENDENT_AMBULATORY_CARE_PROVIDER_SITE_OTHER): Payer: BC Managed Care – PPO | Admitting: Internal Medicine

## 2022-06-30 VITALS — BP 118/76 | HR 88 | Ht 64.0 in | Wt 218.0 lb

## 2022-06-30 DIAGNOSIS — Z1231 Encounter for screening mammogram for malignant neoplasm of breast: Secondary | ICD-10-CM

## 2022-06-30 DIAGNOSIS — E538 Deficiency of other specified B group vitamins: Secondary | ICD-10-CM

## 2022-06-30 DIAGNOSIS — F5101 Primary insomnia: Secondary | ICD-10-CM | POA: Diagnosis not present

## 2022-06-30 DIAGNOSIS — E785 Hyperlipidemia, unspecified: Secondary | ICD-10-CM

## 2022-06-30 DIAGNOSIS — Z0000 Encounter for general adult medical examination without abnormal findings: Secondary | ICD-10-CM | POA: Diagnosis not present

## 2022-06-30 DIAGNOSIS — G43009 Migraine without aura, not intractable, without status migrainosus: Secondary | ICD-10-CM

## 2022-06-30 MED ORDER — CYANOCOBALAMIN 1000 MCG/ML IJ SOLN: 1000.0000 ug | INTRAMUSCULAR | 5 refills | Status: DC

## 2022-06-30 NOTE — Assessment & Plan Note (Signed)
Treated with Fioricet PRN

## 2022-06-30 NOTE — Patient Instructions (Signed)
Schedule mammogram at [facility]

## 2022-06-30 NOTE — Assessment & Plan Note (Signed)
Chronic nightly symptoms treated with Ambien.

## 2022-06-30 NOTE — Progress Notes (Signed)
Date:  06/30/2022   Name:  Christina Carlson   DOB:  [birth year]   MRN:  [id number]   Chief Complaint: Annual Exam (Breast exam no pap ) Christina Carlson is a 47 y.o. female who presents today for her Complete Annual Exam. She feels well. She reports exercising walking daily. She reports she is sleeping well. Breast complaints none.  Mammogram: 11/2020 DEXA: none Pap smear: 01/2019 neg/neg Colonoscopy: 12/2020 repeat 10 yrs (should be 5 yrs since dad passed from colon cancer)  Health Maintenance Due  Topic Date Due   DTaP/Tdap/Td (3 - Td or Tdap) 05/31/2020   MAMMOGRAM  12/19/2021    Immunization History  Administered Date(s) Administered   Hepatitis B 05/31/2010, 08/30/2010, 10/30/2010, 01/14/2017   Hepatitis B, adult 01/14/2017, 03/01/2017   Influenza,inj,Quad PF,6+ Mos 03/16/2016, 01/10/2017, 03/31/2018, 05/15/2021   Influenza-Unspecified 03/16/2016, 03/20/2019   PFIZER(Purple Top)SARS-COV-2 Vaccination 05/18/2019, 06/15/2019   Tdap 07/02/2008, 05/31/2010   Weight management - pt is here for weight management follow up.  Started on [place] on 01/2022.  Doing well without side effects.  Starting weight 226 lbs.   Today's weight 218 lbs.   On Wegovy and starting 1.7 mg/wk next week.  HPI  Lab Results  Component Value Date   NA 138 11/10/2020   K 4.4 11/10/2020   CO2 20 11/10/2020   GLUCOSE 73 11/10/2020   BUN 9 11/10/2020   CREATININE 0.79 11/10/2020   CALCIUM 9.0 11/10/2020   EGFR 93 11/10/2020   GFRNONAA 87 10/05/2018   Lab Results  Component Value Date   CHOL 197 11/10/2020   HDL 54 11/10/2020   LDLCALC 125 (H) 11/10/2020   TRIG 98 11/10/2020   CHOLHDL 3.6 11/10/2020   Lab Results  Component Value Date   TSH 2.510 11/10/2020   Lab Results  Component Value Date   HGBA1C 5.3 09/14/2017   Lab Results  Component Value Date   WBC 6.6 11/10/2020   HGB 11.2 11/10/2020   HCT 35.0 11/10/2020   MCV 86 11/10/2020   PLT 343 11/10/2020   Lab Results  Component  Value Date   ALT 6 11/10/2020   AST 9 11/10/2020   ALKPHOS 100 11/10/2020   BILITOT <0.2 11/10/2020   Lab Results  Component Value Date   VD25OH 43.1 11/26/2015     Review of Systems  Constitutional:  Negative for chills, fatigue and fever.  HENT:  Negative for congestion, hearing loss, tinnitus, trouble swallowing and voice change.   Eyes:  Negative for visual disturbance.  Respiratory:  Negative for cough, chest tightness, shortness of breath and wheezing.   Cardiovascular:  Negative for chest pain, palpitations and leg swelling.  Gastrointestinal:  Negative for abdominal pain, constipation, diarrhea and vomiting.  Endocrine: Negative for polydipsia and polyuria.  Genitourinary:  Negative for dysuria, frequency, genital sores, vaginal bleeding and vaginal discharge.  Musculoskeletal:  Negative for arthralgias, gait problem and joint swelling.  Skin:  Negative for color change and rash.  Neurological:  Negative for dizziness, tremors, light-headedness and headaches.  Hematological:  Negative for adenopathy. Does not bruise/bleed easily.  Psychiatric/Behavioral:  Negative for dysphoric mood and sleep disturbance. The patient is not nervous/anxious.     Patient Active Problem List   Diagnosis Date Noted   Polyp of transverse colon    Atypical squamous cells of undetermined significance on cytologic smear of cervix (ASC-US) 06/27/2015   Vitamin B12 nutritional deficiency 05/07/2015   LBP (low back pain) 09/16/2014   Bariatric surgery  status 09/16/2014   Gastro-esophageal reflux disease without esophagitis 09/16/2014   H/O motion sickness 09/16/2014   Migraine without aura and responsive to treatment 09/16/2014   Idiopathic insomnia 09/16/2014    Allergies  Allergen Reactions   Sulfa Antibiotics Hives   Oxycodone Itching   Tramadol Palpitations    Past Surgical History:  Procedure Laterality Date   BREAST BIOPSY Right 2001   neg   COLONOSCOPY WITH PROPOFOL N/A  01/16/2021   Procedure: COLONOSCOPY WITH PROPOFOL;  Surgeon: Lucilla Lame, MD;  Location: [place];  Service: Endoscopy;  Laterality: N/A;   INGUINAL HERNIA REPAIR  1981   POLYPECTOMY  01/16/2021   Procedure: POLYPECTOMY;  Surgeon: Lucilla Lame, MD;  Location: [place];  Service: Endoscopy;;   ROUX-EN-Y GASTRIC BYPASS  2007   TONSILLECTOMY     VAGINAL HYSTERECTOMY      Social History   Tobacco Use   Smoking status: Never   Smokeless tobacco: Never  Vaping Use   Vaping Use: Never used  Substance Use Topics   Alcohol use: Yes   Drug use: No     Medication list has been reviewed and updated.  Current Meds  Medication Sig   meloxicam (MOBIC) 7.5 MG tablet as needed.   Semaglutide-Weight Management (WEGOVY) 1.7 MG/0.75ML SOAJ Inject 1.7 mg into the skin once a week.   zolpidem (AMBIEN) 10 MG tablet TAKE 1 TABLET(10 MG) BY MOUTH AT BEDTIME AS NEEDED FOR SLEEP   [DISCONTINUED] cyanocobalamin (,VITAMIN B-12,) 1000 MCG/ML injection Inject 1 mL (1,000 mcg total) into the muscle every 14 (fourteen) days.       06/30/2022    8:47 AM 02/02/2022    3:01 PM 08/17/2021    2:20 PM 05/15/2021    9:34 AM  GAD 7 : Generalized Anxiety Score  Nervous, Anxious, on Edge 0 0 0 0  Control/stop worrying 0 1 2 1   Worry too much - different things 0 1 0 1  Trouble relaxing 0 2 1 1   Restless 0 0 0 0  Easily annoyed or irritable 0 1 1 1   Afraid - awful might happen 0 0 0 0  Total GAD 7 Score 0 5 4 4   Anxiety Difficulty Not difficult at all Somewhat difficult Not difficult at all        06/30/2022    8:46 AM 02/02/2022    3:01 PM 08/17/2021    2:20 PM  Depression screen PHQ 2/9  Decreased Interest 0 1 0  Down, Depressed, Hopeless 0 2 1  PHQ - 2 Score 0 3 1  Altered sleeping 0 1 0  Tired, decreased energy 1 1 2   Change in appetite 1 0 0  Feeling bad or failure about yourself  0 0 0  Trouble concentrating 0 0 0  Moving slowly or fidgety/restless 0 0 0  Suicidal thoughts 0  0 0  PHQ-9 Score 2 5 3   Difficult doing work/chores Not difficult at all Not difficult at all Not difficult at all    BP Readings from Last 3 Encounters:  06/30/22 118/76  02/02/22 112/78  10/06/21 (!) 129/91    Physical Exam Vitals and nursing note reviewed.  Constitutional:      General: She is not in acute distress.    Appearance: She is well-developed.  HENT:     Head: Normocephalic and atraumatic.     Right Ear: Tympanic membrane and ear canal normal.     Left Ear: Tympanic membrane and ear canal normal.  Nose:     Right Sinus: No maxillary sinus tenderness.     Left Sinus: No maxillary sinus tenderness.  Eyes:     General: No scleral icterus.       Right eye: No discharge.        Left eye: No discharge.     Conjunctiva/sclera: Conjunctivae normal.  Neck:     Thyroid: No thyromegaly.     Vascular: No carotid bruit.  Cardiovascular:     Rate and Rhythm: Normal rate and regular rhythm.     Pulses: Normal pulses.     Heart sounds: Normal heart sounds.  Pulmonary:     Effort: Pulmonary effort is normal. No respiratory distress.     Breath sounds: No wheezing.  Chest:  Breasts:    Right: No mass, nipple discharge, skin change or tenderness.     Left: No mass, nipple discharge, skin change or tenderness.  Abdominal:     General: Bowel sounds are normal.     Palpations: Abdomen is soft.     Tenderness: There is no abdominal tenderness.  Musculoskeletal:     Cervical back: Normal range of motion. No erythema.     Right lower leg: No edema.     Left lower leg: No edema.  Lymphadenopathy:     Cervical: No cervical adenopathy.  Skin:    General: Skin is warm and dry.     Findings: No rash.  Neurological:     Mental Status: She is alert and oriented to person, place, and time.     Cranial Nerves: No cranial nerve deficit.     Sensory: No sensory deficit.     Deep Tendon Reflexes: Reflexes are normal and symmetric.  Psychiatric:        Attention and Perception:  Attention normal.        Mood and Affect: Mood normal.     Wt Readings from Last 3 Encounters:  06/30/22 218 lb (98.9 kg)  02/02/22 226 lb (102.5 kg)  10/06/21 224 lb (101.6 kg)    BP 118/76   Pulse 88   Ht 5\' 4"  (1.626 m)   Wt 218 lb (98.9 kg)   LMP 02/14/2019 (Exact Date)   SpO2 98%   BMI 37.42 kg/m   Assessment and Plan: Problem List Items Addressed This Visit       Cardiovascular and Mediastinum   Migraine without aura and responsive to treatment (Chronic)    Treated with Fioricet PRN      Relevant Medications   meloxicam (MOBIC) 7.5 MG tablet     Other   Idiopathic insomnia (Chronic)    Chronic nightly symptoms treated with Ambien.      Vitamin B12 nutritional deficiency (Chronic)   Relevant Medications   cyanocobalamin (VITAMIN B12) 1000 MCG/ML injection   Other Relevant Orders   Vitamin B12   Other Visit Diagnoses     Annual physical exam    -  Primary   rec colonoscopy in 5 yrs due to family hx   Relevant Orders   CBC with Differential/Platelet   Comprehensive metabolic panel   Lipid panel   Vitamin B12   TSH   Encounter for screening mammogram for breast cancer       Schedule at [facility]   Mild hyperlipidemia       Relevant Orders   Lipid panel        Partially dictated using Dragon software. Any errors are unintentional.  [facility], MD [facility]  06/30/2022

## 2022-07-01 LAB — COMPREHENSIVE METABOLIC PANEL
ALT: 9 IU/L (ref 0–32)
AST: 11 IU/L (ref 0–40)
Albumin/Globulin Ratio: 2 (ref 1.2–2.2)
Albumin: 4 g/dL (ref 3.9–4.9)
Alkaline Phosphatase: 82 IU/L (ref 44–121)
BUN/Creatinine Ratio: 9 (ref 9–23)
BUN: 7 mg/dL (ref 6–24)
Bilirubin Total: 0.4 mg/dL (ref 0.0–1.2)
CO2: 21 mmol/L (ref 20–29)
Calcium: 9 mg/dL (ref 8.7–10.2)
Chloride: 105 mmol/L (ref 96–106)
Creatinine, Ser: 0.75 mg/dL (ref 0.57–1.00)
Globulin, Total: 2 g/dL (ref 1.5–4.5)
Glucose: 86 mg/dL (ref 70–99)
Potassium: 4.1 mmol/L (ref 3.5–5.2)
Sodium: 140 mmol/L (ref 134–144)
Total Protein: 6 g/dL (ref 6.0–8.5)
eGFR: 98 mL/min/{1.73_m2} (ref >59)

## 2022-07-01 LAB — CBC WITH DIFFERENTIAL/PLATELET
Basophils Absolute: 0 10*3/uL (ref 0.0–0.2)
Basos: 0 %
EOS (ABSOLUTE): 0.1 10*3/uL (ref 0.0–0.4)
Eos: 1 %
Hematocrit: 37.6 % (ref 34.0–46.6)
Hemoglobin: 11.9 g/dL (ref 11.1–15.9)
Immature Grans (Abs): 0 10*3/uL (ref 0.0–0.1)
Immature Granulocytes: 0 %
Lymphocytes Absolute: 1.8 10*3/uL (ref 0.7–3.1)
Lymphs: 27 %
MCH: 28.4 pg (ref 26.6–33.0)
MCHC: 31.6 g/dL (ref 31.5–35.7)
MCV: 90 fL (ref 79–97)
Monocytes Absolute: 0.4 10*3/uL (ref 0.1–0.9)
Monocytes: 6 %
Neutrophils Absolute: 4.2 10*3/uL (ref 1.4–7.0)
Neutrophils: 66 %
Platelets: 328 10*3/uL (ref 150–450)
RBC: 4.19 x10E6/uL (ref 3.77–5.28)
RDW: 13.5 % (ref 11.7–15.4)
WBC: 6.5 10*3/uL (ref 3.4–10.8)

## 2022-07-01 LAB — LIPID PANEL
Chol/HDL Ratio: 3.5 ratio (ref 0.0–4.4)
Cholesterol, Total: 164 mg/dL (ref 100–199)
HDL: 47 mg/dL (ref >39)
LDL Chol Calc (NIH): 102 mg/dL — ABNORMAL HIGH (ref 0–99)
Triglycerides: 81 mg/dL (ref 0–149)
VLDL Cholesterol Cal: 15 mg/dL (ref 5–40)

## 2022-07-01 LAB — TSH: TSH: 1.99 u[IU]/mL (ref 0.450–4.500)

## 2022-07-01 LAB — VITAMIN B12: Vitamin B-12: 875 pg/mL (ref 232–1245)

## 2022-07-05 ENCOUNTER — Other Ambulatory Visit: Payer: Self-pay | Admitting: Internal Medicine

## 2022-07-05 ENCOUNTER — Encounter: Payer: Self-pay | Admitting: Internal Medicine

## 2022-07-05 DIAGNOSIS — N951 Menopausal and female climacteric states: Secondary | ICD-10-CM

## 2022-07-05 MED ORDER — ESTRADIOL 0.025 MG/24HR TD PTWK: 0.0250 mg | MEDICATED_PATCH | TRANSDERMAL | 3 refills | Status: DC

## 2022-07-05 NOTE — Telephone Encounter (Signed)
Please review.  KP

## 2022-07-21 ENCOUNTER — Other Ambulatory Visit: Payer: Self-pay | Admitting: Internal Medicine

## 2022-07-21 NOTE — Telephone Encounter (Signed)
Requested Prescriptions  Pending Prescriptions Disp Refills   [place] 1.7 MG/0.75ML SOAJ [Pharmacy Med Name: WEGOVY 1.7MG/0.75ML PF PEN INJ] 3 mL 2    Sig: INJECT 1.7 MG UNDER THE SKIN ONCE WEEKLY     Endocrinology:  Diabetes - GLP-1 Receptor Agonists - semaglutide Failed - 07/21/2022  5:54 PM      Failed - HBA1C in normal range and within 180 days    Hgb A1c MFr Bld  Date Value Ref Range Status  09/14/2017 5.3 4.8 - 5.6 % Final    Comment:             Prediabetes: 5.7 - 6.4          Diabetes: >6.4          Glycemic control for adults with diabetes: <7.0          Passed - Cr in normal range and within 360 days    Creatinine, Ser  Date Value Ref Range Status  06/30/2022 0.75 0.57 - 1.00 mg/dL Final         Passed - Valid encounter within last 6 months    Recent Outpatient Visits           3 weeks ago Annual physical exam   [place] [facility] at [facility], Jesse Sans, MD   5 months ago Idiopathic insomnia   [place] [facility] [place] at [facility], Jesse Sans, MD   10 months ago Non-recurrent acute suppurative otitis media of left ear without spontaneous rupture of tympanic membrane    [facility] [place] at [facility], Jesse Sans, MD   11 months ago BMI 39.0-39.9,adult   [place] at [facility], Jesse Sans, MD   1 year ago Need for immunization against influenza   [place] at [facility], Jesse Sans, MD

## 2022-08-16 ENCOUNTER — Encounter: Payer: Self-pay | Admitting: Internal Medicine

## 2022-09-08 ENCOUNTER — Other Ambulatory Visit: Payer: Self-pay | Admitting: Internal Medicine

## 2022-09-08 DIAGNOSIS — G44209 Tension-type headache, unspecified, not intractable: Secondary | ICD-10-CM

## 2022-09-09 ENCOUNTER — Encounter: Payer: Self-pay | Admitting: Internal Medicine

## 2022-09-09 NOTE — Telephone Encounter (Signed)
Pease review.  KP

## 2022-09-10 ENCOUNTER — Other Ambulatory Visit: Payer: Self-pay | Admitting: Internal Medicine

## 2022-09-10 DIAGNOSIS — G44209 Tension-type headache, unspecified, not intractable: Secondary | ICD-10-CM

## 2022-09-10 MED ORDER — BUTALBITAL-APAP-CAFFEINE 50-325-40 MG PO TABS: ORAL_TABLET | ORAL | 0 refills | Status: DC

## 2022-09-21 ENCOUNTER — Encounter: Payer: Self-pay | Admitting: Internal Medicine

## 2022-09-23 ENCOUNTER — Encounter: Payer: Self-pay | Admitting: Internal Medicine

## 2022-09-28 ENCOUNTER — Encounter: Payer: Self-pay | Admitting: Internal Medicine

## 2022-09-29 ENCOUNTER — Other Ambulatory Visit: Payer: Self-pay | Admitting: Internal Medicine

## 2022-09-29 DIAGNOSIS — F5101 Primary insomnia: Secondary | ICD-10-CM

## 2022-09-29 DIAGNOSIS — E538 Deficiency of other specified B group vitamins: Secondary | ICD-10-CM

## 2022-09-29 MED ORDER — ZOLPIDEM TARTRATE 10 MG PO TABS: ORAL_TABLET | ORAL | 0 refills | Status: DC

## 2022-09-29 MED ORDER — CYANOCOBALAMIN 1000 MCG/ML IJ SOLN: 1000.0000 ug | INTRAMUSCULAR | 0 refills | Status: DC

## 2022-09-29 NOTE — Telephone Encounter (Signed)
Pt response.  KP

## 2022-09-29 NOTE — Telephone Encounter (Signed)
Please review.  KP

## 2022-10-26 ENCOUNTER — Other Ambulatory Visit: Payer: Self-pay | Admitting: Internal Medicine

## 2022-10-26 DIAGNOSIS — F5101 Primary insomnia: Secondary | ICD-10-CM

## 2022-10-27 ENCOUNTER — Other Ambulatory Visit: Payer: Self-pay | Admitting: Internal Medicine

## 2022-11-16 ENCOUNTER — Encounter: Payer: Self-pay | Admitting: Internal Medicine

## 2022-11-16 NOTE — Telephone Encounter (Signed)
Please review.  KP

## 2022-11-19 ENCOUNTER — Encounter: Payer: Self-pay | Admitting: Internal Medicine

## 2022-11-19 NOTE — Telephone Encounter (Signed)
Please review.  KP

## 2022-11-22 ENCOUNTER — Other Ambulatory Visit: Payer: Self-pay | Admitting: Internal Medicine

## 2022-11-22 DIAGNOSIS — F5101 Primary insomnia: Secondary | ICD-10-CM

## 2022-11-22 MED ORDER — ZOLPIDEM TARTRATE 10 MG PO TABS
ORAL_TABLET | ORAL | 5 refills | Status: DC
Start: 2022-11-22 — End: 2023-02-23

## 2022-11-29 ENCOUNTER — Encounter: Payer: Self-pay | Admitting: Internal Medicine

## 2022-12-28 ENCOUNTER — Encounter: Payer: Self-pay | Admitting: Internal Medicine

## 2022-12-28 ENCOUNTER — Other Ambulatory Visit: Payer: Self-pay | Admitting: Internal Medicine

## 2022-12-28 DIAGNOSIS — G44209 Tension-type headache, unspecified, not intractable: Secondary | ICD-10-CM

## 2022-12-28 MED ORDER — BUTALBITAL-APAP-CAFFEINE 50-325-40 MG PO TABS: ORAL_TABLET | ORAL | 0 refills | Status: DC

## 2022-12-28 NOTE — Telephone Encounter (Signed)
Please review.  KP

## 2023-01-17 ENCOUNTER — Encounter: Payer: Self-pay | Admitting: Internal Medicine

## 2023-02-02 ENCOUNTER — Other Ambulatory Visit: Payer: Self-pay | Admitting: Internal Medicine

## 2023-02-02 DIAGNOSIS — G44209 Tension-type headache, unspecified, not intractable: Secondary | ICD-10-CM

## 2023-02-15 ENCOUNTER — Telehealth: Payer: Self-pay | Admitting: Internal Medicine

## 2023-02-15 NOTE — Telephone Encounter (Signed)
done

## 2023-02-15 NOTE — Telephone Encounter (Signed)
Copied from CRM [id number]. Topic: Appointment Scheduling - Scheduling Inquiry for Clinic >> Feb 15, 2023 12:11 PM Epimenio Foot F wrote: Reason for CRM: Pt is calling in because she received a call saying to schedule her physical for January. Pt says she needs an afternoon appointment after 2-2:30. Attempted to schedule the appointment but there are no afternoon appointments showing until June for a physical. Pt says if someone is going to reach out to her to do it after 2:30 due to work. Please advise.

## 2023-02-15 NOTE — Telephone Encounter (Signed)
Is afternoon CPE ok?  KP

## 2023-02-18 ENCOUNTER — Other Ambulatory Visit: Payer: Self-pay | Admitting: Internal Medicine

## 2023-02-18 DIAGNOSIS — G44209 Tension-type headache, unspecified, not intractable: Secondary | ICD-10-CM

## 2023-02-22 ENCOUNTER — Encounter: Payer: Self-pay | Admitting: Internal Medicine

## 2023-02-23 ENCOUNTER — Ambulatory Visit: Payer: BC Managed Care – PPO | Admitting: Internal Medicine

## 2023-02-23 ENCOUNTER — Encounter: Payer: Self-pay | Admitting: Internal Medicine

## 2023-02-23 VITALS — BP 122/74 | HR 79 | Ht 64.0 in | Wt 224.0 lb

## 2023-02-23 DIAGNOSIS — F5101 Primary insomnia: Secondary | ICD-10-CM | POA: Diagnosis not present

## 2023-02-23 MED ORDER — ZOLPIDEM TARTRATE ER 12.5 MG PO TBCR
12.5000 mg | EXTENDED_RELEASE_TABLET | Freq: Every evening | ORAL | 0 refills | Status: DC | PRN
Start: 2023-02-23 — End: 2023-03-21

## 2023-02-23 NOTE — Assessment & Plan Note (Signed)
IR Ambien is not helping her stay asleep Will try ambien CR.  Sleep hygiene reviewed.

## 2023-02-23 NOTE — Progress Notes (Signed)
Date:  02/23/2023   Name:  Christina Carlson   DOB:  02/06/1976   MRN:  [id number]   Chief Complaint: Insomnia (Patient c/o not sleeping well at night. Would like to discuss prescription medications to help with sleep. Patient has [name] but its not helping her sleep anymore.)  Insomnia Primary symptoms: sleep disturbance, premature morning awakening.   The onset quality is undetermined. The problem occurs every several days. The problem has been gradually worsening since onset.  Has been on Ambien for several years with good results but now working an earlier shift and going to bed earlier.  Wakes up after about 4 hours and struggles to get back to sleep.  Lab Results  Component Value Date   NA 140 06/30/2022   K 4.1 06/30/2022   CO2 21 06/30/2022   GLUCOSE 86 06/30/2022   BUN 7 06/30/2022   CREATININE 0.75 06/30/2022   CALCIUM 9.0 06/30/2022   EGFR 98 06/30/2022   GFRNONAA 87 10/05/2018   Lab Results  Component Value Date   CHOL 164 06/30/2022   HDL 47 06/30/2022   LDLCALC 102 (H) 06/30/2022   TRIG 81 06/30/2022   CHOLHDL 3.5 06/30/2022   Lab Results  Component Value Date   TSH 1.990 06/30/2022   Lab Results  Component Value Date   HGBA1C 5.3 09/14/2017   Lab Results  Component Value Date   WBC 6.5 06/30/2022   HGB 11.9 06/30/2022   HCT 37.6 06/30/2022   MCV 90 06/30/2022   PLT 328 06/30/2022   Lab Results  Component Value Date   ALT 9 06/30/2022   AST 11 06/30/2022   ALKPHOS 82 06/30/2022   BILITOT 0.4 06/30/2022   Lab Results  Component Value Date   VD25OH 43.1 11/26/2015     Review of Systems  Constitutional:  Negative for chills and fatigue.  Respiratory:  Negative for chest tightness and shortness of breath.   Cardiovascular:  Negative for chest pain.  Psychiatric/Behavioral:  Positive for sleep disturbance. Negative for dysphoric mood. The patient has insomnia. The patient is not nervous/anxious.     Patient Active Problem List   Diagnosis  Date Noted   Menopausal vasomotor syndrome 07/05/2022   Polyp of transverse colon    Atypical squamous cells of undetermined significance on cytologic smear of cervix (ASC-US) 06/27/2015   Vitamin B12 nutritional deficiency 05/07/2015   LBP (low back pain) 09/16/2014   Bariatric surgery status 09/16/2014   Gastro-esophageal reflux disease without esophagitis 09/16/2014   H/O motion sickness 09/16/2014   Migraine without aura and responsive to treatment 09/16/2014   Idiopathic insomnia 09/16/2014    Allergies  Allergen Reactions   Sulfa Antibiotics Hives   Oxycodone Itching   Tramadol Palpitations    Past Surgical History:  Procedure Laterality Date   BREAST BIOPSY Right 2001   neg   COLONOSCOPY WITH PROPOFOL N/A 01/16/2021   Procedure: COLONOSCOPY WITH PROPOFOL;  Surgeon: Midge Minium, MD;  [facility];  Service: Endoscopy;  Laterality: N/A;   INGUINAL HERNIA REPAIR  1981   POLYPECTOMY  01/16/2021   Procedure: POLYPECTOMY;  Surgeon: Midge Minium, MD;  [facility];  Service: Endoscopy;;   ROUX-EN-Y GASTRIC BYPASS  2007   TONSILLECTOMY     VAGINAL HYSTERECTOMY      Social History   Tobacco Use   Smoking status: Never   Smokeless tobacco: Never  Vaping Use   Vaping status: Never Used  Substance Use Topics  Alcohol use: Yes   Drug use: No     Medication list has been reviewed and updated.  Current Meds  Medication Sig   butalbital-acetaminophen-caffeine (FIORICET) 50-325-40 MG tablet Take 1 tablet by mouth daily as needed for headache.   cyanocobalamin (VITAMIN B12) 1000 MCG/ML injection Inject 1 mL (1,000 mcg total) into the muscle every 14 (fourteen) days.   zolpidem (AMBIEN CR) 12.5 MG CR tablet Take 1 tablet (12.5 mg total) by mouth at bedtime as needed for sleep.   [DISCONTINUED] zolpidem (AMBIEN) 10 MG tablet TAKE 1 TABLET BY MOUTH AT BEDTIME AS NEEDED FOR SLEEP       02/23/2023    8:34 AM 06/30/2022    8:47 AM 02/02/2022     3:01 PM 08/17/2021    2:20 PM  GAD 7 : Generalized Anxiety Score  Nervous, Anxious, on Edge 2 0 0 0  Control/stop worrying 1 0 1 2  Worry too much - different things 2 0 1 0  Trouble relaxing 1 0 2 1  Restless 1 0 0 0  Easily annoyed or irritable 0 0 1 1  Afraid - awful might happen 0 0 0 0  Total GAD 7 Score 7 0 5 4  Anxiety Difficulty Not difficult at all Not difficult at all Somewhat difficult Not difficult at all       02/23/2023    8:34 AM 06/30/2022    8:46 AM 02/02/2022    3:01 PM  Depression screen PHQ 2/9  Decreased Interest 0 0 1  Down, Depressed, Hopeless 0 0 2  PHQ - 2 Score 0 0 3  Altered sleeping 3 0 1  Tired, decreased energy 1 1 1   Change in appetite 0 1 0  Feeling bad or failure about yourself  0 0 0  Trouble concentrating 0 0 0  Moving slowly or fidgety/restless 0 0 0  Suicidal thoughts 0 0 0  PHQ-9 Score 4 2 5   Difficult doing work/chores Not difficult at all Not difficult at all Not difficult at all    BP Readings from Last 3 Encounters:  02/23/23 122/74  06/30/22 118/76  02/02/22 112/78    Physical Exam Vitals and nursing note reviewed.  Constitutional:      General: She is not in acute distress.    Appearance: She is well-developed.  HENT:     Head: Normocephalic and atraumatic.  Cardiovascular:     Rate and Rhythm: Normal rate and regular rhythm.  Pulmonary:     Effort: Pulmonary effort is normal. No respiratory distress.     Breath sounds: No wheezing or rhonchi.  Skin:    General: Skin is warm and dry.     Findings: No rash.  Neurological:     Mental Status: She is alert and oriented to person, place, and time.  Psychiatric:        Mood and Affect: Mood normal.        Behavior: Behavior normal.        Thought Content: Thought content normal.     Wt Readings from Last 3 Encounters:  02/23/23 224 lb (101.6 kg)  06/30/22 218 lb (98.9 kg)  02/02/22 226 lb (102.5 kg)    BP 122/74   Pulse 79   Ht 5\' 4"  (1.626 m)   Wt 224 lb  (101.6 kg)   LMP 02/14/2019 (Exact Date)   SpO2 97%   BMI 38.45 kg/m   Assessment and Plan:  Problem List Items Addressed This Visit  Unprioritized   Idiopathic insomnia - Primary (Chronic)    IR Ambien is not helping her stay asleep Will try ambien CR.  Sleep hygiene reviewed.      Relevant Medications   zolpidem (AMBIEN CR) 12.5 MG CR tablet    No follow-ups on file.    [name], MD [facility] Medicine [place]

## 2023-02-26 ENCOUNTER — Other Ambulatory Visit: Payer: Self-pay | Admitting: Internal Medicine

## 2023-02-26 DIAGNOSIS — G44209 Tension-type headache, unspecified, not intractable: Secondary | ICD-10-CM

## 2023-03-03 ENCOUNTER — Other Ambulatory Visit: Payer: Self-pay | Admitting: Internal Medicine

## 2023-03-03 ENCOUNTER — Encounter: Payer: Self-pay | Admitting: Internal Medicine

## 2023-03-03 DIAGNOSIS — G44209 Tension-type headache, unspecified, not intractable: Secondary | ICD-10-CM

## 2023-03-03 MED ORDER — BUTALBITAL-APAP-CAFFEINE 50-325-40 MG PO TABS: ORAL_TABLET | ORAL | 0 refills | Status: DC

## 2023-03-03 NOTE — Telephone Encounter (Signed)
Please review.  KP

## 2023-03-07 ENCOUNTER — Other Ambulatory Visit: Payer: Self-pay | Admitting: Internal Medicine

## 2023-03-07 ENCOUNTER — Telehealth: Payer: Self-pay | Admitting: Internal Medicine

## 2023-03-07 DIAGNOSIS — G44209 Tension-type headache, unspecified, not intractable: Secondary | ICD-10-CM

## 2023-03-07 MED ORDER — BUTALBITAL-APAP-CAFFEINE 50-325-40 MG PO TABS
ORAL_TABLET | ORAL | 0 refills | Status: AC
Start: 2023-03-07 — End: ?

## 2023-03-07 NOTE — Telephone Encounter (Signed)
Sent patient mychart message

## 2023-03-07 NOTE — Telephone Encounter (Signed)
[place] with [facility] pharmacy called asking that provider send the Generic Fioricet .  She said cancel the one at [facility] but it is more convenient for her to get it at [facility]  CB#  [phone]

## 2023-03-21 ENCOUNTER — Other Ambulatory Visit: Payer: Self-pay | Admitting: Internal Medicine

## 2023-03-21 DIAGNOSIS — F5101 Primary insomnia: Secondary | ICD-10-CM

## 2023-03-28 ENCOUNTER — Other Ambulatory Visit: Payer: Self-pay | Admitting: Internal Medicine

## 2023-03-28 DIAGNOSIS — E538 Deficiency of other specified B group vitamins: Secondary | ICD-10-CM

## 2023-04-18 ENCOUNTER — Encounter: Payer: Self-pay | Admitting: Internal Medicine

## 2023-04-19 ENCOUNTER — Ambulatory Visit: Payer: BC Managed Care – PPO | Admitting: Internal Medicine

## 2023-04-19 NOTE — Telephone Encounter (Signed)
Please review.  KP

## 2023-04-19 NOTE — Telephone Encounter (Signed)
Pt response.  KP

## 2023-05-04 ENCOUNTER — Encounter: Payer: Self-pay | Admitting: Internal Medicine

## 2023-05-05 ENCOUNTER — Ambulatory Visit: Payer: BC Managed Care – PPO | Admitting: Internal Medicine

## 2023-07-26 ENCOUNTER — Encounter: Payer: BC Managed Care – PPO | Admitting: Internal Medicine

## 2023-08-09 ENCOUNTER — Other Ambulatory Visit: Payer: Self-pay | Admitting: Internal Medicine

## 2023-08-10 NOTE — Telephone Encounter (Signed)
 Requested medication (s) are due for refill today:   Provider to review  Requested medication (s) are on the active medication list:   Yes  Future visit scheduled:   No   Last ordered: 03/28/2023 10 each, 0 refills  No protocol for this.      Requested Prescriptions  Pending Prescriptions Disp Refills   BD INTEGRA SYRINGE 25G X 1" 3 ML MISC [Pharmacy Med Name: BD Integra Syringe 3 mL 25 gauge x 1" (syringe with needle, safety)] 10 each 0    Sig: Use to inject B-12     There is no refill protocol information for this order

## 2023-08-10 NOTE — Telephone Encounter (Signed)
 In reading her chart she has established with a new PCP at [facility] with Titus Dubin, FNP on 05/18/2023.

## 2023-10-28 ENCOUNTER — Other Ambulatory Visit: Payer: Self-pay | Admitting: Internal Medicine

## 2024-02-09 ENCOUNTER — Other Ambulatory Visit: Payer: Self-pay | Admitting: Internal Medicine

## 2024-02-10 NOTE — Telephone Encounter (Signed)
 Requested medication (s) are due for refill today: routing for review  Requested medication (s) are on the active medication list: yes  Last refill:  10/28/23  Future visit scheduled: no  Notes to clinic:  unable to attach protocol, routing for review     Requested Prescriptions  Pending Prescriptions Disp Refills   BD INTEGRA SYRINGE 25G X 1 3 ML MISC [Pharmacy Med Name: BD Integra Syringe 3 mL 25 gauge x 1 (syringe with needle, safety)] 12 each 0    Sig: Use to inject B-12     There is no refill protocol information for this order
